# Patient Record
Sex: Male | Born: 1970
Health system: Southern US, Community
[De-identification: ages and names within clinical notes are randomized; demographics above are authoritative.]

## PROBLEM LIST (undated history)

## (undated) DIAGNOSIS — E079 Disorder of thyroid, unspecified: Secondary | ICD-10-CM

## (undated) HISTORY — DX: Disorder of thyroid, unspecified: E07.9

---

## 1998-05-30 ENCOUNTER — Emergency Department (HOSPITAL_COMMUNITY): Admission: EM | Admit: 1998-05-30 | Discharge: 1998-05-30 | Payer: Self-pay | Admitting: Emergency Medicine

## 1998-08-16 ENCOUNTER — Encounter: Admission: RE | Admit: 1998-08-16 | Discharge: 1998-08-16 | Payer: Self-pay | Admitting: Internal Medicine

## 2003-06-11 ENCOUNTER — Encounter: Payer: Self-pay | Admitting: Internal Medicine

## 2003-06-11 ENCOUNTER — Encounter: Admission: RE | Admit: 2003-06-11 | Discharge: 2003-06-11 | Payer: Self-pay | Admitting: Internal Medicine

## 2011-12-17 ENCOUNTER — Ambulatory Visit
Admission: RE | Admit: 2011-12-17 | Discharge: 2011-12-17 | Disposition: A | Discharge status: Home / Self Care | Payer: 59 | Source: Ambulatory Visit | Attending: Family Medicine | Admitting: Family Medicine

## 2011-12-17 ENCOUNTER — Other Ambulatory Visit: Payer: Self-pay | Admitting: Family Medicine

## 2011-12-17 DIAGNOSIS — R52 Pain, unspecified: Secondary | ICD-10-CM

## 2011-12-17 DIAGNOSIS — M549 Dorsalgia, unspecified: Secondary | ICD-10-CM

## 2012-06-05 ENCOUNTER — Encounter (INDEPENDENT_AMBULATORY_CARE_PROVIDER_SITE_OTHER): Payer: Self-pay | Admitting: Surgery

## 2012-06-05 ENCOUNTER — Ambulatory Visit (INDEPENDENT_AMBULATORY_CARE_PROVIDER_SITE_OTHER): Payer: 59 | Admitting: Surgery

## 2012-06-05 VITALS — BP 126/84 | HR 72 | Temp 98.2°F | Resp 16 | Ht 66.0 in | Wt 182.6 lb

## 2012-06-05 DIAGNOSIS — R229 Localized swelling, mass and lump, unspecified: Secondary | ICD-10-CM

## 2012-06-05 DIAGNOSIS — R220 Localized swelling, mass and lump, head: Secondary | ICD-10-CM

## 2012-06-05 HISTORY — PX: OTHER SURGICAL HISTORY: SHX169

## 2012-06-05 NOTE — Progress Notes (Signed)
Subjective:     Patient ID: Angel Brown, male   DOB: [birth year], 41 y.o.   MRN: [id number]  HPI  Angel Brown  January 05, 1971 [id number]  Patient Care Team: Cammie Fulp as PCP - General (Family Medicine)  This patient is a 42 y.o.male who presents today for surgical evaluation at the request of Dr. Jillyn Hidden.   Reason for visit: Enlarging masses on scalp. Patient is a pleasant male. He is felt a lump on the back side of his head for a few years. Gradually it has gotten larger. He is starting to get tender at times especially when he comes his hair or bumps up against it. He noticed a few nodules on the near the right and one on the left as well.   Because of them getting larger and becoming symptomatic, he wished to consider to have them removed. Weight stable. No history of neurological problems or other skin disorders. No history of skin infection. No history of significant active disease or other problems.  Patient Active Problem List  Diagnoses  (none) - all problems resolved or deleted    Past Medical History  Diagnosis Date  . Thyroid disease     No past surgical history on file.  History   Social History  . Marital Status: Married    Spouse Name: N/A    Number of Children: N/A  . Years of Education: N/A   Occupational History  . Not on file.   Social History Main Topics  . Smoking status: Never Smoker   . Smokeless tobacco: Not on file  . Alcohol Use: No  . Drug Use: No  . Sexually Active:    Other Topics Concern  . Not on file   Social History Narrative  . No narrative on file    No family history on file.  Current Outpatient Prescriptions  Medication Sig Dispense Refill  . levothyroxine (SYNTHROID, LEVOTHROID) 75 MCG tablet Take 75 mcg by mouth daily.         No Known Allergies  BP 126/84  Pulse 72  Temp 98.2 F (36.8 C)  Resp 16  Ht 5\' 6"  (1.676 m)  Wt 182 lb 9.6 oz (82.827 kg)  BMI 29.47 kg/m2  No results found.   Review of Systems   Constitutional: Negative for fever, chills and diaphoresis.  HENT: Negative for nosebleeds, sore throat, facial swelling, mouth sores, trouble swallowing and ear discharge.   Eyes: Negative for photophobia, discharge and visual disturbance.  Respiratory: Negative for choking, chest tightness, shortness of breath and stridor.   Cardiovascular: Negative for chest pain and palpitations.  Gastrointestinal: Negative for nausea, vomiting, abdominal pain, diarrhea, constipation, blood in stool, abdominal distention, anal bleeding and rectal pain.  Genitourinary: Negative for dysuria, urgency, difficulty urinating and testicular pain.  Musculoskeletal: Negative for myalgias, back pain, arthralgias and gait problem.  Skin: Negative for color change, pallor, rash and wound.  Neurological: Negative for dizziness, speech difficulty, weakness, numbness and headaches.  Hematological: Negative for adenopathy. Does not bruise/bleed easily.  Psychiatric/Behavioral: Negative for hallucinations, confusion and agitation.       Objective:   Physical Exam  Constitutional: He is oriented to person, place, and time. He appears well-developed and well-nourished. No distress.  HENT:  Head: Normocephalic.    Mouth/Throat: Oropharynx is clear and moist. No oropharyngeal exudate.  Eyes: Conjunctivae and EOM are normal. Pupils are equal, round, and reactive to light. No scleral icterus.  Neck: Normal range of motion. Neck supple. No  tracheal deviation present.  Cardiovascular: Normal rate, regular rhythm and intact distal pulses.   Pulmonary/Chest: Effort normal and breath sounds normal. No respiratory distress.  Abdominal: Soft. He exhibits no distension. There is no tenderness. Hernia confirmed negative in the right inguinal area and confirmed negative in the left inguinal area.  Musculoskeletal: Normal range of motion. He exhibits no tenderness.  Lymphadenopathy:    He has no cervical adenopathy.       Right:  No inguinal adenopathy present.       Left: No inguinal adenopathy present.  Neurological: He is alert and oriented to person, place, and time. No cranial nerve deficit. He exhibits normal muscle tone. Coordination normal.  Skin: Skin is warm and dry. No rash noted. He is not diaphoretic. No erythema. No pallor.  Psychiatric: He has a normal mood and affect. His behavior is normal. Judgment and thought content normal.       Assessment:     Enlarging/new scalp masses    Plan:     Consider removal. They have gotten larger and more numerous. They are probably benign. Because they had gotten larger and are becoming symptomatic, he wishes to have them removed. Given the location on the scalp, I plan to do this in the operating room under sterile conditions where hemostasis can be ensured  The pathophysiology of skin & subcutaneous masses was discussed.  Natural history risks without surgery were discussed.  I recommended surgery to remove the mass.  I explained the technique of removal with use of local anesthesia & possible need for more aggressive sedation/anesthesia for patient comfort.    Risks such as bleeding, infection, heart attack, death, and other risks were discussed.  I noted a good likelihood this will help address the problem.   Possibility that this will not correct all symptoms was explained. Possibility of regrowth/recurrence of the mass was discussed.  We will work to minimize complications. Questions were answered.  The patient expresses understanding & wishes to proceed with surgery.

## 2012-06-05 NOTE — Patient Instructions (Signed)
Cyst Removal Your caregiver has removed a cyst. A cyst is a sac containing a semi-solid material. Cysts may occur any place on your body. They may remain small for years or gradually get larger. A sebaceous cyst is an enlarged (dilated) sweat gland filled with old sweat (sebum). Unattended, these may become large (the size of a softball) over several years time. These are often removed for improved appearance (cosmetic) reasons or before they become infected to form an abscess. An abscess is an infected cyst. HOME CARE INSTRUCTIONS   Keep your bandage clean and dry. You may change your bandage after 24 hours. If your bandage sticks, use warm water to gently loosen it. Pat the area dry with a clean towel before putting on another bandage.   If possible, keep the area where the cyst was removed raised to relieve soreness, swelling, and promote healing.   If you have stitches, keep them clean and dry.   You may clean your stitches gently with a cotton swab dipped in warm soapy water.   Do not soak the area where the cyst was removed or go swimming. You may shower.   Do not overuse the area where your cyst was removed.   Return in 7 days or as directed to have your stitches removed.   Take medicines as instructed by your caregiver.  SEEK IMMEDIATE MEDICAL CARE IF:   An oral temperature above 102 F (38.9 C) develops, not controlled by medication.   Blood continues to soak through the bandage.   You have increasing pain in the area where your cyst was removed.   You have redness, swelling, pus, a bad smell, soreness (inflammation), or red streaks coming away from the stitches. These are signs of infection.  MAKE SURE YOU:   Understand these instructions.   Will watch your condition.   Will get help right away if you are not doing well or get worse.  Document Released: 12/14/2000 Document Revised: 12/06/2011 Document Reviewed: 04/08/2008 [employer] Patient Information 2012 [employer]. 

## 2012-06-06 DIAGNOSIS — R220 Localized swelling, mass and lump, head: Secondary | ICD-10-CM | POA: Insufficient documentation

## 2012-07-31 DIAGNOSIS — L723 Sebaceous cyst: Secondary | ICD-10-CM

## 2012-08-11 ENCOUNTER — Encounter (INDEPENDENT_AMBULATORY_CARE_PROVIDER_SITE_OTHER): Payer: Self-pay | Admitting: Surgery

## 2012-08-11 ENCOUNTER — Ambulatory Visit (INDEPENDENT_AMBULATORY_CARE_PROVIDER_SITE_OTHER): Payer: 59 | Admitting: Surgery

## 2012-08-11 VITALS — BP 122/78 | HR 70 | Temp 97.8°F | Resp 14 | Ht 66.0 in | Wt 181.2 lb

## 2012-08-11 DIAGNOSIS — R220 Localized swelling, mass and lump, head: Secondary | ICD-10-CM

## 2012-08-11 DIAGNOSIS — R229 Localized swelling, mass and lump, unspecified: Secondary | ICD-10-CM

## 2012-08-11 NOTE — Progress Notes (Signed)
Subjective:     Patient ID: Angel Brown, male   DOB: Jul 22, 1971, 41 y.o.   MRN: [id number]  HPI  Angel Brown  [birth year] [id number]  Patient Care Team: Cammie Fulp as PCP - General (Family Medicine)  This patient is a 41 y.o.male who presents today for surgical evaluation.   Procedure: Removal of subcutaneous masses of scalp.  Most likely pilar cysts  The patient comes in today feeling well.  Minimal soreness.  Off meds.  Energy level good.  Thought he may felt a new nodule on his right parietal region.  Patient Active Problem List  Diagnosis  . Scalp masses x4, prob pilar cysts    Past Medical History  Diagnosis Date  . Thyroid disease     Past Surgical History  Procedure Date  . Excision of scalp mass 06/05/12    x4    History   Social History  . Marital Status: Married    Spouse Name: N/A    Number of Children: N/A  . Years of Education: N/A   Occupational History  . Not on file.   Social History Main Topics  . Smoking status: Never Smoker   . Smokeless tobacco: Not on file  . Alcohol Use: No  . Drug Use: No  . Sexually Active: Not on file   Other Topics Concern  . Not on file   Social History Narrative  . No narrative on file    History reviewed. No pertinent family history.  Current Outpatient Prescriptions  Medication Sig Dispense Refill  . levothyroxine (SYNTHROID, LEVOTHROID) 75 MCG tablet Take 75 mcg by mouth daily.         No Known Allergies  BP 122/78  Pulse 70  Temp 97.8 F (36.6 C) (Temporal)  Resp 14  Ht 5\' 6"  (1.676 m)  Wt 181 lb 4 oz (82.214 kg)  BMI 29.25 kg/m2  No results found.   Review of Systems  Constitutional: Negative for fever, chills and diaphoresis.  HENT: Negative for sore throat, trouble swallowing and neck pain.   Eyes: Negative for photophobia and visual disturbance.  Respiratory: Negative for choking and shortness of breath.   Cardiovascular: Negative for chest pain and palpitations.    Gastrointestinal: Negative for nausea, vomiting, abdominal distention, anal bleeding and rectal pain.  Genitourinary: Negative for dysuria, urgency, difficulty urinating and testicular pain.  Musculoskeletal: Negative for myalgias, arthralgias and gait problem.  Skin: Negative for color change and rash.  Neurological: Negative for dizziness, speech difficulty, weakness and numbness.  Hematological: Negative for adenopathy.  Psychiatric/Behavioral: Negative for hallucinations, confusion and agitation.       Objective:   Physical Exam  Constitutional: He is oriented to person, place, and time. He appears well-developed and well-nourished. No distress.  HENT:  Head: Normocephalic.    Mouth/Throat: Oropharynx is clear and moist. No oropharyngeal exudate.  Eyes: Conjunctivae and EOM are normal. Pupils are equal, round, and reactive to light. No scleral icterus.  Neck: Normal range of motion. No tracheal deviation present.  Cardiovascular: Normal rate, normal heart sounds and intact distal pulses.   Pulmonary/Chest: Effort normal. No respiratory distress.  Abdominal: Soft. He exhibits no distension. There is no tenderness. Hernia confirmed negative in the right inguinal area and confirmed negative in the left inguinal area.        No hernias  Musculoskeletal: Normal range of motion. He exhibits no tenderness.  Neurological: He is alert and oriented to person, place, and time. No cranial nerve deficit.  He exhibits normal muscle tone. Coordination normal.  Skin: Skin is warm and dry. No rash noted. He is not diaphoretic.  Psychiatric: He has a normal mood and affect. His behavior is normal.       Assessment:     Healing well s/p excision of pilar SQ scalp cysts    Plan:     Increase activity as tolerated.  Do not push through pain.  Good chance.  The area should gradually heal up over time.  Stitches on scanner absorbable and fall off on their own.  Small questionable nodule in the  right parietal region may be just be a follicle.  Follow it.  If he gets much larger or becomes problematic, consider reexamination reexcision.  Otherwise he can followup when necessary. The patient expressed understanding and appreciation

## 2012-08-11 NOTE — Patient Instructions (Addendum)
Epidermal Cyst An epidermal cyst is usually a small, painless lump under the skin. Cysts often occur on the face, neck, stomach, chest, or genitals. The cyst may be filled with a bad smelling paste. Do not pop your cyst. Popping the cyst can cause pain and puffiness (swelling). HOME CARE   Only take medicines as told by your doctor.   Take your medicine (antibiotics) as told. Finish it even if you start to feel better.  GET HELP RIGHT AWAY IF:  Your cyst is tender, red, or puffy.   You are not getting better, or you are getting worse.   You have any questions or concerns.  MAKE SURE YOU:  Understand these instructions.   Will watch your condition.   Will get help right away if you are not doing well or get worse.  Document Released: 01/24/2005 Document Revised: 12/06/2011 Document Reviewed: 06/25/2011 [facility] Patient Information 2012 [place], [place].

## 2013-01-09 ENCOUNTER — Other Ambulatory Visit: Payer: Self-pay | Admitting: Family Medicine

## 2013-01-09 ENCOUNTER — Ambulatory Visit
Admission: RE | Admit: 2013-01-09 | Discharge: 2013-01-09 | Disposition: A | Discharge status: Home / Self Care | Payer: 59 | Source: Ambulatory Visit | Attending: Family Medicine | Admitting: Family Medicine

## 2013-01-09 DIAGNOSIS — M545 Low back pain, unspecified: Secondary | ICD-10-CM

## 2014-01-13 ENCOUNTER — Ambulatory Visit (INDEPENDENT_AMBULATORY_CARE_PROVIDER_SITE_OTHER): Payer: 59 | Admitting: Podiatrist

## 2014-01-13 ENCOUNTER — Encounter: Payer: Self-pay | Admitting: Podiatrist

## 2014-01-13 ENCOUNTER — Ambulatory Visit (INDEPENDENT_AMBULATORY_CARE_PROVIDER_SITE_OTHER): Payer: 59

## 2014-01-13 VITALS — BP 140/85 | HR 78 | Resp 16 | Ht 66.0 in | Wt 175.0 lb

## 2014-01-13 DIAGNOSIS — M2020 Hallux rigidus, unspecified foot: Secondary | ICD-10-CM

## 2014-01-13 DIAGNOSIS — M1990 Unspecified osteoarthritis, unspecified site: Secondary | ICD-10-CM

## 2014-01-13 DIAGNOSIS — M205X9 Other deformities of toe(s) (acquired), unspecified foot: Secondary | ICD-10-CM

## 2014-01-13 DIAGNOSIS — M109 Gout, unspecified: Secondary | ICD-10-CM

## 2014-01-13 MED ORDER — TRIAMCINOLONE ACETONIDE 10 MG/ML IJ SUSP
10.0000 mg | Freq: Once | INTRAMUSCULAR | Status: AC
  Administered 2014-01-13: 10 mg

## 2014-01-13 NOTE — Patient Instructions (Addendum)
I would recommend a retest of your uric acid in 1 month since the first test was at the high range of normal-- I can do the test or your primary care physician can also do the test.  Call if there is no improvement with the injection from today's visit.    Gout Gout is an inflammatory arthritis caused by a buildup of uric acid crystals in the joints. Uric acid is a chemical that is normally present in the blood. When the level of uric acid in the blood is too high it can form crystals that deposit in your joints and tissues. This causes joint redness, soreness, and swelling (inflammation). Repeat attacks are common. Over time, uric acid crystals can form into masses (tophi) near a joint, destroying bone and causing disfigurement. Gout is treatable and often preventable. CAUSES  The disease begins with elevated levels of uric acid in the blood. Uric acid is produced by your body when it breaks down a naturally found substance called purines. Certain foods you eat, such as meats and fish, contain high amounts of purines. Causes of an elevated uric acid level include:  Being passed down from parent to child (heredity).  Diseases that cause increased uric acid production (such as obesity, psoriasis, and certain cancers).  Excessive alcohol use.  Diet, especially diets rich in meat and seafood.  Medicines, including certain cancer-fighting medicines (chemotherapy), water pills (diuretics), and aspirin.  Chronic kidney disease. The kidneys are no longer able to remove uric acid well.  Problems with metabolism. Conditions strongly associated with gout include:  Obesity.  High blood pressure.  High cholesterol.  Diabetes. Not everyone with elevated uric acid levels gets gout. It is not understood why some people get gout and others do not. Surgery, joint injury, and eating too much of certain foods are some of the factors that can lead to gout attacks. SYMPTOMS   An attack of gout comes on  quickly. It causes intense pain with redness, swelling, and warmth in a joint.  Fever can occur.  Often, only one joint is involved. Certain joints are more commonly involved:  Base of the big toe.  Knee.  Ankle.  Wrist.  Finger. Without treatment, an attack usually goes away in a few days to weeks. Between attacks, you usually will not have symptoms, which is different from many other forms of arthritis. DIAGNOSIS  Your caregiver will suspect gout based on your symptoms and exam. In some cases, tests may be recommended. The tests may include:  Blood tests.  Urine tests.  X-rays.  Joint fluid exam. This exam requires a needle to remove fluid from the joint (arthrocentesis). Using a microscope, gout is confirmed when uric acid crystals are seen in the joint fluid. TREATMENT  There are two phases to gout treatment: treating the sudden onset (acute) attack and preventing attacks (prophylaxis).  Treatment of an Acute Attack.  Medicines are used. These include anti-inflammatory medicines or steroid medicines.  An injection of steroid medicine into the affected joint is sometimes necessary.  The painful joint is rested. Movement can worsen the arthritis.  You may use warm or cold treatments on painful joints, depending which works best for you.  Treatment to Prevent Attacks.  If you suffer from frequent gout attacks, your caregiver may advise preventive medicine. These medicines are started after the acute attack subsides. These medicines either help your kidneys eliminate uric acid from your body or decrease your uric acid production. You may need to stay on  these medicines for a very long time.  The early phase of treatment with preventive medicine can be associated with an increase in acute gout attacks. For this reason, during the first few months of treatment, your caregiver may also advise you to take medicines usually used for acute gout treatment. Be sure you understand  your caregiver's directions. Your caregiver may make several adjustments to your medicine dose before these medicines are effective.  Discuss dietary treatment with your caregiver or dietitian. Alcohol and drinks high in sugar and fructose and foods such as meat, poultry, and seafood can increase uric acid levels. Your caregiver or dietician can advise you on drinks and foods that should be limited. HOME CARE INSTRUCTIONS   Do not take aspirin to relieve pain. This raises uric acid levels.  Only take over-the-counter or prescription medicines for pain, discomfort, or fever as directed by your caregiver.  Rest the joint as much as possible. When in bed, keep sheets and blankets off painful areas.  Keep the affected joint raised (elevated).  Apply warm or cold treatments to painful joints. Use of warm or cold treatments depends on which works best for you.  Use crutches if the painful joint is in your leg.  Drink enough fluids to keep your urine clear or pale yellow. This helps your body get rid of uric acid. Limit alcohol, sugary drinks, and fructose drinks.  Follow your dietary instructions. Pay careful attention to the amount of protein you eat. Your daily diet should emphasize fruits, vegetables, whole grains, and fat-free or low-fat milk products. Discuss the use of coffee, vitamin C, and cherries with your caregiver or dietician. These may be helpful in lowering uric acid levels.  Maintain a healthy body weight. SEEK MEDICAL CARE IF:   You develop diarrhea, vomiting, or any side effects from medicines.  You do not feel better in 24 hours, or you are getting worse. SEEK IMMEDIATE MEDICAL CARE IF:   Your joint becomes suddenly more tender, and you have chills or a fever. MAKE SURE YOU:   Understand these instructions.  Will watch your condition.  Will get help right away if you are not doing well or get worse. Document Released: 12/14/2000 Document Revised: 04/13/2013 Document  Reviewed: 07/30/2012 [facility] Patient Information 2014 [place].  Information for patients with Gout  Gout defined-Gout occurs when urate crystals accumulate in your joint causing the inflammation and intense pain of gout attack.  Urate crystals can form when you have high levels of uric acid in your blood.  Your body produces uric acid when it breaks down prurines-substances that are found naturally in your body, as well as in certain foods such as organ meats, anchioves, herring, asparagus, and mushrooms.  Normally uric acid dissolves in your blood and passes through your kidneys into your urine.  But sometimes your body either produces too much uric acid or your kidneys excrete too little uric acid.  When this happens, uric acid can build up, forming sharp needle-like urate crystals in a joint or surrounding tissue that cause pain, inflammation and swelling.    Gout is characterized by sudden, severe attacks of pain, redness and tenderness in joints, often the joint at the base of the big toe.  Gout is complex form of arthritis that can affect anyone.  Men are more likely to get gout but women become increasingly more susceptible to gout after menopause.  An acute attack of gout can wake you up in the middle of the night  with the sensation that your big toe is on fire.  The affected joint is hot, swollen and so tender that even the weight or the sheet on it may seem intolerable.  If you experience symptoms of an acute gout attack it is important to your doctor as soon as the symptoms start.  Gout that goes untreated can lead to worsening pain and joint damage.  Risk Factors:  You are more likely to develop gout if you have high levels of uric acid in your body.    Factors that increase the uric acid level in your body include:  Lifestyle factors.  Excessive alcohol use-generally more than two drinks a day for men and more than one for women increase the risk of gout.  Medical  conditions.  Certain conditions make it more likely that you will develop gout.  These include hypertension, and chronic conditions such as diabetes, high levels of fat and cholesterol in the blood, and narrowing of the arteries.  Certain medications.  The uses of Thiazide diuretics- commonly used to treat hypertension and low dose aspirin can also increase uric acid levels.  Family history of gout.  If other members of your family have had gout, you are more likely to develop the disease.  Age and sex. Gout occurs more often in men than it does in women, primarily because women tend to have lower uric acid levels than men do.  Men are more likely to develop gout earlier usually between the ages of 22-50- whereas women generally develop signs and symptoms after menopause.    Tests and diagnosis:  Tests to help diagnose gout may include:  Blood test.  Your doctor may recommend a blood test to measure the uric acid level in your blood .  Blood tests can be misleading, though.  Some people have high uric acid levels but never experience gout.  And some people have signs and symptoms of gout, but don't have unusual levels of uric acid in their blood.  Joint fluid test.  Your doctor may use a needle to draw fluid from your affected joint.  When examined under the microscope, your joint fluid may reveal urate crystals.  Treatment:  Treatment for gout usually involves medications.  What medications you and your doctor choose will be based on your current health and other medications you currently take.  Gout medications can be used to treat acute gout attacks and prevent future attacks as well as reduce your risk of complications from gout such as the development of tophi from urate crystal deposits.  Alternative medicine:   Certain foods have been studied for their potential to lower uric acid levels, including:  Coffee.  Studies have found an association between coffee drinking (regular and  decaf) and lower uric acid levels.  The evidence is not enough to encourage non-coffee drinkers to start, but it may give clues to new ways of treating gout in the future.  Vitamin C.  Supplements containing vitamin C may reduce the levels of uric acid in your blood.  However, vitamin as a treatment for gout. Don't assume that if a little vitamin C is good, than lots is better.  Megadoses of vitamin C may increase your bodies uric acid levels.  Cherries.  Cherries have been associated with lower levels of uric acid in studies, but it isn't clear if they have any effect on gout signs and symptoms.  Eating more cherries and other dar-colored fruits, such as blackberries, blueberries, purple grapes and  raspberries, may be a safe way to support your gout treatment.    Lifestyle/Diet Recommendations:   Drink 8 to 16 cups ( about 2 to 4 liters) of fluid each day, with at least half being water.  Avoid alcohol  Eat a moderate amount of protein, preferably from healthy sources, such as low-fat or fat-free dairy, tofu, eggs, and nut butters.  Limit you daily intake of meat, fish, and poultry to 4 to 6 ounces.  Avoid high fat meats and desserts.  Decrease you intake of shellfish, beef, lamb, pork, eggs and cheese.  Choose a good source of vitamin C daily such as citrus fruits, strawberries, broccoli,  brussel sprouts, papaya, and cantaloupe.   Choose a good source of vitamin A every other day such as yellow fruits, or dark green/yellow vegetables.  Avoid drastic weigh reduction or fasting.  If weigh loss is desired lose it over a period of several months.  See "dietary considerations.." chart for specific food recommendations.  Dietary Considerations for people with Gout  Food with negligible purine content (0-15 mg of purine nitrogen per 100 grams food)  May use as desired except on calorie variations  Non fat milk Cocoa Cereals (except in list II) Hard candies  Buttermilk Carbonated drinks  Vegetables (except in list II) Sherbet  Coffee Fruits Sugar Honey  Tea Cottage Cheese Gelatin-jell-o Salt  Fruit juice Breads Angel food Cake   Herbs/spices Jams/Jellies [employer]    Foods that do not contain excessive purine content, but must be limited due to fat content  Cream Eggs Oil and Salad Dressing  Half and Half Peanut Butter Chocolate  Whole Milk Cakes Potato Chips  Butter Ice Cream Fried Foods  Cheese Nuts Waffles, pancakes   List II: Food with moderate purine content (50-150 mg of purine nitrogen per 100 grams of food)  Limit total amount each day to 5 oz. cooked Lean meat, other than those on list III   Poultry, other than those on list III Fish, other than those on list III   Seafood, other than those on list III  These foods may be used occasionally  Peas Lentils Bran  Spinach Oatmeal Dried Beans and Peas  Asparagus Wheat Germ Mushrooms   Additional information about meat choices  Choose fish and poultry, particularly without skin, often.  Select lean, well trimmed cuts of meat.  Avoid all fatty meats, bacon , sausage, fried meats, fried fish, or poultry, luncheon meats, cold cuts, hot dogs, meats canned or frozen in gravy, spareribs and frozen and packaged prepared meats.   List III: Foods with HIGH purine content / Foods to AVOID (150-800 mg of purine nitrogen per 100 grams of food)  Anchovies Herring Meat Broths  Liver Mackerel Meat Extracts  Kidney Scallops Meat Drippings  Sardines Wild Game Mincemeat  Sweetbreads Goose Gravy  Heart Tongue Yeast, baker's and brewers   Commercial soups made with any of the foods listed in List II or List III  In addition avoid all alcoholic beverages

## 2014-01-13 NOTE — Progress Notes (Signed)
   Subjective:    Patient ID: Angel BernheimMaruthy K Haworth, male    DOB: Oct 27, 1971, 43 y.o.   MRN: [id number]  HPI Comments: "I have pain in this area under the toe"  Pt states has been having aching sensations in 1st MPJ right for few weeks. The area was somewhat red and swollen. He has limited ROM, especially dorsiflexion. Its painful to walk. PCP evaluated thought maybe gout, did bloodwork and Rx'd indocin. Blood work showed normal uric acid. Not currently taking Indocin.     Review of Systems  Musculoskeletal: Positive for gait problem.  Allergic/Immunologic: Positive for environmental allergies.  All other systems reviewed and are negative.       Objective:   Physical Exam GENERAL APPEARANCE: Alert, conversant. Appropriately groomed. No acute distress.  VASCULAR: Pedal pulses palpable and strong bilateral.  Capillary refill time is immediate to all digits,  Proximal to distal cooling it warm to warm.  Digital hair growth is present bilateral  NEUROLOGIC: sensation is intact epicritically and protectively to 5.07 monofilament at 5/5 sites bilateral.  Light touch is intact bilateral, vibratory sensation intact bilateral, achilles tendon reflex is intact bilateral.  MUSCULOSKELETAL: discomfort and swelling present at the first mpj of the right foot.  No limitation on range of motion present. DERMATOLOGIC: mild increased swelling present.  No redness, streaking or signs of infection present.  No open lesions or interdigital maceration present       Assessment & Plan:  Probable gout versus arthritis 1st mpj Plan:  Discussed etiology, pathology, conservative vs. Surgical therapies and at this time an injection was recommended.  The patient agreed and a sterile skin prep was applied.  An injection consisting of kenalog and marcaine mixture was infiltrated into the right first metararsal phalangeal joint.  The patient tolerated this well and was given instructions for aftercare. The patient was  instructed on retesting the uric acid in one month. He is at the high end of normal and still could be positive for gout.

## 2014-04-06 ENCOUNTER — Ambulatory Visit (INDEPENDENT_AMBULATORY_CARE_PROVIDER_SITE_OTHER): Payer: 59 | Admitting: Podiatry

## 2014-04-06 ENCOUNTER — Encounter: Payer: Self-pay | Admitting: Podiatry

## 2014-04-06 DIAGNOSIS — M778 Other enthesopathies, not elsewhere classified: Secondary | ICD-10-CM

## 2014-04-06 DIAGNOSIS — M7750 Other enthesopathy of unspecified foot: Secondary | ICD-10-CM

## 2014-04-06 DIAGNOSIS — M109 Gout, unspecified: Secondary | ICD-10-CM

## 2014-04-06 DIAGNOSIS — M779 Enthesopathy, unspecified: Secondary | ICD-10-CM

## 2014-04-06 MED ORDER — INDOMETHACIN 50 MG PO CAPS: 50.0000 mg | ORAL_CAPSULE | Freq: Two times a day (BID) | ORAL | Status: DC

## 2014-04-06 NOTE — Patient Instructions (Signed)

## 2014-04-06 NOTE — Progress Notes (Signed)
He presents today after having seen Dr. Donzetta KohutEdgerton for gout attack about the first metatarsophalangeal joint. He never followed up on his second blood work. He states it is been like this for the past 2-3 days and is extremely painful. He states that he's been following the gout diet and is pretty much a vegetarian.  Objective: Vital signs are stable he is alert and oriented x3. Pulses are palpable right. Bright red first metatarsophalangeal joint medial aspect of the first MTPJ is very sensitive and tender. Range of motion of the first metatarsophalangeal joint is tender.  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  Plan: Discussed etiology pathology conservative versus surgical therapies. I a injected periarticularly  today about the first metatarsophalangeal joint right foot. This was injected with Kenalog and local anesthetic. I wrote a prescription for indomethacin and wrote another prescription for blood draw within the next 2-3 weeks. At which time we may consider putting him on a lifelong regimen of allopurinol.

## 2014-04-20 ENCOUNTER — Telehealth: Payer: Self-pay | Admitting: *Deleted

## 2014-04-20 LAB — CBC WITH DIFFERENTIAL/PLATELET
BASOS ABS: 0 10*3/uL (ref 0.0–0.1)
BASOS PCT: 0 % (ref 0–1)
EOS ABS: 0.2 10*3/uL (ref 0.0–0.7)
EOS PCT: 3 % (ref 0–5)
HCT: 45.7 % (ref 39.0–52.0)
HEMOGLOBIN: 15.1 g/dL (ref 13.0–17.0)
Lymphocytes Relative: 30 % (ref 12–46)
Lymphs Abs: 2 10*3/uL (ref 0.7–4.0)
MCH: 27.4 pg (ref 26.0–34.0)
MCHC: 33 g/dL (ref 30.0–36.0)
MCV: 82.9 fL (ref 78.0–100.0)
MONO ABS: 0.7 10*3/uL (ref 0.1–1.0)
Monocytes Relative: 10 % (ref 3–12)
Neutro Abs: 3.9 10*3/uL (ref 1.7–7.7)
Neutrophils Relative %: 57 % (ref 43–77)
PLATELETS: 344 10*3/uL (ref 150–400)
RBC: 5.51 MIL/uL (ref 4.22–5.81)
RDW: 13.9 % (ref 11.5–15.5)
WBC: 6.8 10*3/uL (ref 4.0–10.5)

## 2014-04-20 LAB — URIC ACID: Uric Acid, Serum: 5.9 mg/dL (ref 4.0–7.8)

## 2014-04-20 NOTE — Telephone Encounter (Signed)
Message copied by Bing ReeGUNN, Emory Leaver L on Tue Apr 20, 2014  4:14 PM ------      Message from: Ernestene KielHYATT, MAX T      Created: Tue Apr 20, 2014  7:27 AM       Blood work looks good.  No infection and uric acid is normal. ------

## 2014-04-20 NOTE — Telephone Encounter (Signed)
Called and left message for patient regarding bloodwork

## 2014-04-27 ENCOUNTER — Telehealth: Payer: Self-pay | Admitting: *Deleted

## 2014-04-27 NOTE — Telephone Encounter (Signed)
Calling to get lab work results from 04/19/2014.  I returned his call.  He asked if we could mail him the report.  I informed him he can come by to pick up a copy of the report.  He said he would come by tomorrow to pick it up.

## 2015-04-27 ENCOUNTER — Ambulatory Visit (INDEPENDENT_AMBULATORY_CARE_PROVIDER_SITE_OTHER): Payer: 59 | Admitting: Podiatry

## 2015-04-27 ENCOUNTER — Encounter: Payer: Self-pay | Admitting: Podiatry

## 2015-04-27 ENCOUNTER — Ambulatory Visit (INDEPENDENT_AMBULATORY_CARE_PROVIDER_SITE_OTHER): Payer: 59

## 2015-04-27 VITALS — BP 120/84 | HR 75 | Resp 18

## 2015-04-27 DIAGNOSIS — B351 Tinea unguium: Secondary | ICD-10-CM | POA: Diagnosis not present

## 2015-04-27 DIAGNOSIS — R52 Pain, unspecified: Secondary | ICD-10-CM

## 2015-04-27 DIAGNOSIS — M10071 Idiopathic gout, right ankle and foot: Secondary | ICD-10-CM

## 2015-04-27 DIAGNOSIS — M109 Gout, unspecified: Secondary | ICD-10-CM

## 2015-04-27 LAB — COMPLETE METABOLIC PANEL WITH GFR
ALBUMIN: 4.7 g/dL (ref 3.5–5.2)
ALT: 22 U/L (ref 0–53)
AST: 19 U/L (ref 0–37)
Alkaline Phosphatase: 69 U/L (ref 39–117)
BUN: 13 mg/dL (ref 6–23)
CO2: 31 mEq/L (ref 19–32)
Calcium: 9.7 mg/dL (ref 8.4–10.5)
Chloride: 100 mEq/L (ref 96–112)
Creat: 1.18 mg/dL (ref 0.50–1.35)
GFR, EST AFRICAN AMERICAN: 86 mL/min
GFR, EST NON AFRICAN AMERICAN: 75 mL/min
Glucose, Bld: 81 mg/dL (ref 70–99)
Potassium: 4.9 mEq/L (ref 3.5–5.3)
Sodium: 139 mEq/L (ref 135–145)
Total Bilirubin: 0.7 mg/dL (ref 0.2–1.2)
Total Protein: 7.9 g/dL (ref 6.0–8.3)

## 2015-04-27 LAB — CBC WITH DIFFERENTIAL/PLATELET
Basophils Absolute: 0 10*3/uL (ref 0.0–0.1)
Basophils Relative: 0 % (ref 0–1)
Eosinophils Absolute: 0.1 10*3/uL (ref 0.0–0.7)
Eosinophils Relative: 2 % (ref 0–5)
HCT: 45.7 % (ref 39.0–52.0)
HEMOGLOBIN: 15.5 g/dL (ref 13.0–17.0)
LYMPHS ABS: 2 10*3/uL (ref 0.7–4.0)
Lymphocytes Relative: 27 % (ref 12–46)
MCH: 28.4 pg (ref 26.0–34.0)
MCHC: 33.9 g/dL (ref 30.0–36.0)
MCV: 83.9 fL (ref 78.0–100.0)
MPV: 9.2 fL (ref 8.6–12.4)
Monocytes Absolute: 0.7 10*3/uL (ref 0.1–1.0)
Monocytes Relative: 10 % (ref 3–12)
NEUTROS PCT: 61 % (ref 43–77)
Neutro Abs: 4.5 10*3/uL (ref 1.7–7.7)
PLATELETS: 346 10*3/uL (ref 150–400)
RBC: 5.45 MIL/uL (ref 4.22–5.81)
RDW: 14.2 % (ref 11.5–15.5)
WBC: 7.4 10*3/uL (ref 4.0–10.5)

## 2015-04-27 LAB — C-REACTIVE PROTEIN: CRP: 0.9 mg/dL — AB (ref <0.60)

## 2015-04-27 LAB — URIC ACID: Uric Acid, Serum: 7.8 mg/dL (ref 4.0–7.8)

## 2015-04-27 NOTE — Patient Instructions (Signed)

## 2015-04-28 ENCOUNTER — Telehealth: Payer: Self-pay | Admitting: *Deleted

## 2015-04-28 LAB — SEDIMENTATION RATE: SED RATE: 6 mm/h (ref 0–15)

## 2015-04-28 MED ORDER — COLCHICINE 0.6 MG PO TABS: 0.6000 mg | ORAL_TABLET | Freq: Every day | ORAL | Status: DC

## 2015-04-28 NOTE — Telephone Encounter (Signed)
-----   Message from Vivi BarrackMatthew R Wagoner, DPM sent at 04/28/2015  8:12 AM EDT ----- Can someone please call this patient and let him know that is blood work was normal for the most part. One of the inflammation markers was a little elevated and the uric acid was a the very top of normal. I do think it is gout still and will continue to treat for it. Thanks.

## 2015-04-28 NOTE — Telephone Encounter (Signed)
I contacted the pt informed of Dr. Gabriel RungWagoner's statement and recommendations.

## 2015-04-28 NOTE — Telephone Encounter (Addendum)
Pt called states no medication at the pharmacy, please call. Informed pt's wife that the medication had been ordered.

## 2015-04-28 NOTE — Telephone Encounter (Signed)
I sent it back over. Thanks.

## 2015-05-01 ENCOUNTER — Encounter: Payer: Self-pay | Admitting: Podiatry

## 2015-05-01 DIAGNOSIS — M109 Gout, unspecified: Secondary | ICD-10-CM | POA: Insufficient documentation

## 2015-05-01 NOTE — Progress Notes (Signed)
Patient ID: Angel BernheimMaruthy K Cristina, male   DOB: 1971-08-30, 44 y.o.   MRN: [id number]  Subjective: 44 year old male presents the office today with complaints of right big toe joint swelling, redness, pain. He states on Monday the symptoms started. He doesn't that he has a history of gout he had multiple gout attacks over the past year. He states that the last time he was in the office he had a steroid injection which helped alleviate his symptoms. He denies any change in his diet. He denies any history of injury or trauma. The patient is also concerned about possible nail fungus. States his nails have started become slightly discolored and a different shape. He denies any pain associate the nails and denies any redness or drainage. Denies any systemic complaints such as fevers, chills, nausea, vomiting. No acute changes since last appointment, and no other complaints at this time.   Objective: AAO x3, NAD DP/PT pulses palpable bilaterally, CRT less than 3 seconds Protective sensation intact with Simms Weinstein monofilament, vibratory sensation intact, Achilles tendon reflex intact There is mild tenderness to palpation along the right first MTPJ. There is edema and erythema overlying the area. There is pain with range of motion of the first MTPJ. There is no other areas of edema, erythema, tenderness to bilateral lower extremity's. No areas of pinpoint bony tenderness or pain with vibratory sensation. MMT 5/5, ROM WNL. No edema, erythema, increase in warmth to bilateral lower extremities.  Nails are slightly dystrophic, discolored without any surrounding erythema or drainage. There is no tenderness to palpation around the nails. No open lesions or pre-ulcerative lesions.  No pain with calf compression, swelling, warmth, erythema  Assessment: 44 year old male right reoccurring gout; onychodystrophy  Plan: -X-rays were obtained and reviewed with the patient -All treatment options discussed with the  patient including all alternatives, risks, complications.  -Discussed likely etiology of his symptoms. Does appear that he is having another gout attack. Discussed steroid injection to the area for which she were to proceed with. Under sterile conditions a total of 1.5 mL of a mixture of dexamethasone phosphate and 0.5% Marcaine plain was infiltrated into the area of maximal tenderness on the right first MTPJ. Band-Aid was applied. Postinjection care was discussed the patient. -Prescribed colchicine. -Discussed diet modifications. -As he does appear to have multiple gout attacks of the same foot I do believe that he would likely benefit from long-term therapy. We'll discuss this further next appointment. -[facility]osis.  -Patient encouraged to call the office with any questions, concerns, change in symptoms.

## 2015-05-03 ENCOUNTER — Telehealth: Payer: Self-pay | Admitting: *Deleted

## 2015-05-04 ENCOUNTER — Telehealth: Payer: Self-pay | Admitting: *Deleted

## 2015-05-04 MED ORDER — COLCHICINE 0.6 MG PO TABS: 0.6000 mg | ORAL_TABLET | Freq: Every day | ORAL | Status: DC

## 2015-05-04 NOTE — Telephone Encounter (Signed)
Pt's wife states the pharmacy has not received a prior authorization for Colchicine.  Delydia - pt coordinator states [facility] has not sent authorization yet.  I called pt and informed if she would choose a [facility] pharmacy we could call it there, but not to give insurance information to pharmacy and the $4.00 formulary would be used.

## 2015-05-09 ENCOUNTER — Other Ambulatory Visit: Payer: Self-pay | Admitting: *Deleted

## 2015-05-09 NOTE — Telephone Encounter (Signed)
I called patient to see how he was doing per Dr. Ardelle AntonWagoner because his Colchicine was denied by his insurance.  "My foot is doing better since I started taking the medication.  Hopefully by the time I come in to see him on Friday it'll be completely better."  Okay, I'll let Dr. Ardelle AntonWagoner know.

## 2015-05-13 ENCOUNTER — Ambulatory Visit (INDEPENDENT_AMBULATORY_CARE_PROVIDER_SITE_OTHER): Payer: 59 | Admitting: Podiatry

## 2015-05-13 ENCOUNTER — Encounter: Payer: Self-pay | Admitting: Podiatry

## 2015-05-13 VITALS — BP 138/86 | HR 62 | Resp 12

## 2015-05-13 DIAGNOSIS — L603 Nail dystrophy: Secondary | ICD-10-CM | POA: Diagnosis not present

## 2015-05-13 DIAGNOSIS — M10071 Idiopathic gout, right ankle and foot: Secondary | ICD-10-CM

## 2015-05-13 DIAGNOSIS — M109 Gout, unspecified: Secondary | ICD-10-CM

## 2015-05-13 NOTE — Progress Notes (Signed)
Patient ID: Angel BernheimMaruthy K Brown, Angel Brown   DOB: [birth year], 44 y.o.   MRN: [id number]  Subjective: 44 year old Angel Brown presents the office today for follow-up evaluation of right foot gout. He states that his last appointment he did take the colchicine and he has had significant improvement in symptoms. He states the swelling has decreased as well as the redness. He has no pain to the toe at this time. He is also inquiring about his nail fungal culture results. Denies any systemic complaints such as fevers, chills, nausea, vomiting. No acute changes since last appointment, and no other complaints at this time.   Objective: AAO x3, NAD DP/PT pulses palpable bilaterally, CRT less than 3 seconds Protective sensation intact with Simms Weinstein monofilament, vibratory sensation intact, Achilles tendon reflex intact At this time there is significant improvement in symptoms of the right first MTPJ. There is no upon edema, erythema, increase in warmth. There is no pain with first MTPJ range of motion although the range of motion is somewhat limited. Nails continue be sightly dystrophic and discolored without any surrounding erythema or drainage. No tenderness to palpation along the nails. No other areas of pinpoint bony tenderness or pain with vibratory sensation. MMT 5/5, ROM WNL. No edema, erythema, increase in warmth to bilateral lower extremities.  No open lesions or pre-ulcerative lesions.  No pain with calf compression, swelling, warmth, erythema  Assessment: [age] year old Angel Brown with resolved gout right foot, onychodystrophy  Plan: -All treatment options discussed with the patient including all alternatives, risks, complications.  -At this time his symptoms for gout resolved. Discussed the patient long-term therapy due to his multiple recurrent gout attacks. At this time he elects to proceed with a modifications to see if this helps. I strongly encouraged patient to follow with his primary care physician in  regards to gout treatment.  -Will call the patient with the results of the nail biopsy once the culture is obtained. He will try topical treatment if positive for nail fungus. Will likely start Kerydin.  -Follow up after nail biopsy or sooner should if any problems arise.  -Patient encouraged to call the office with any questions, concerns, change in symptoms.

## 2015-05-18 ENCOUNTER — Telehealth: Payer: Self-pay | Admitting: *Deleted

## 2015-05-18 MED ORDER — COLCHICINE 0.6 MG PO CAPS: 1.0000 | ORAL_CAPSULE | Freq: Every day | ORAL | Status: DC

## 2015-05-18 MED ORDER — TAVABOROLE 5 % EX SOLN: 1.0000 "application " | Freq: Every day | CUTANEOUS | Status: DC

## 2015-05-18 NOTE — Telephone Encounter (Signed)
"  Calling regarding my next appointment with Dr. Ardelle AntonWagoner.  I was wondering if I could do it over the phone.  The last time I saw Dr. Ardelle AntonWagoner he had said we would do it over the phone.  I also need to talk to Dr. Ardelle AntonWagoner about the medication, there's a generic."  I returned his call.  "Dr. Ardelle AntonWagoner said I could tell you over the phone.  Your culture came back positive for Saprophytic Fungi.  Dr. Ardelle AntonWagoner said he can prescribe Jodi GeraldsKerydin but it may not be successful.  "Okay, I'd like to try it.  I also want to know if he could call in Mitigare which is the generic for what he prescribed."  I called in before but I will send it again.  I sent it to [facility] before.  Also, your Jodi GeraldsKerydin will come in the mail from a pharmacy called [facility], they may call you to get insurance information.  "Okay, that will be great."

## 2015-05-27 ENCOUNTER — Ambulatory Visit: Payer: 59 | Admitting: Podiatry

## 2015-06-01 ENCOUNTER — Telehealth: Payer: Self-pay | Admitting: *Deleted

## 2015-06-01 MED ORDER — CICLOPIROX 8 % EX SOLN: Freq: Every day | CUTANEOUS | Status: DC

## 2015-06-01 NOTE — Telephone Encounter (Signed)
I left patient a message to call me back in regards to his prescription.  I need to inform him that his insurance will not cover Kerydin.  Dr. Ardelle AntonWagoner has prescribed Penlac.

## 2015-06-02 NOTE — Telephone Encounter (Signed)
Pt called for information concerning his medication.  I reviewed pt's records and Dr. Ardelle AntonWagoner stated pt's insurance would not cover the Kerydin, orders changed to Penlac and sent to pt's pharmacy.  I informed pt of new orders and pt states understanding.

## 2015-09-20 ENCOUNTER — Other Ambulatory Visit: Payer: Self-pay | Admitting: Podiatry

## 2015-10-27 ENCOUNTER — Encounter: Payer: Self-pay | Admitting: Podiatry

## 2016-12-26 ENCOUNTER — Telehealth: Payer: Self-pay | Admitting: *Deleted

## 2016-12-26 MED ORDER — NONFORMULARY OR COMPOUNDED ITEM: 2 refills | Status: DC

## 2016-12-26 NOTE — Telephone Encounter (Signed)
Pt presents to office with dtr for her follow up with Dr. Ardelle AntonWagoner. Pt asked for refills of Penlac and Mitigare. Dr. Ardelle AntonWagoner states if pt has been on Penlac for a year change to [place] Onychomycosis nail fungus, and to wait until he had a flare of gout and make an appt. Pt states understanding.

## 2017-02-18 DIAGNOSIS — J3089 Other allergic rhinitis: Secondary | ICD-10-CM | POA: Diagnosis not present

## 2017-02-18 DIAGNOSIS — J3081 Allergic rhinitis due to animal (cat) (dog) hair and dander: Secondary | ICD-10-CM | POA: Diagnosis not present

## 2017-02-18 DIAGNOSIS — J301 Allergic rhinitis due to pollen: Secondary | ICD-10-CM | POA: Diagnosis not present

## 2017-02-22 DIAGNOSIS — J301 Allergic rhinitis due to pollen: Secondary | ICD-10-CM | POA: Diagnosis not present

## 2017-02-22 DIAGNOSIS — J3089 Other allergic rhinitis: Secondary | ICD-10-CM | POA: Diagnosis not present

## 2017-02-22 DIAGNOSIS — J3081 Allergic rhinitis due to animal (cat) (dog) hair and dander: Secondary | ICD-10-CM | POA: Diagnosis not present

## 2017-02-25 DIAGNOSIS — J301 Allergic rhinitis due to pollen: Secondary | ICD-10-CM | POA: Diagnosis not present

## 2017-02-25 DIAGNOSIS — J3081 Allergic rhinitis due to animal (cat) (dog) hair and dander: Secondary | ICD-10-CM | POA: Diagnosis not present

## 2017-02-25 DIAGNOSIS — J3089 Other allergic rhinitis: Secondary | ICD-10-CM | POA: Diagnosis not present

## 2017-03-02 DIAGNOSIS — M47812 Spondylosis without myelopathy or radiculopathy, cervical region: Secondary | ICD-10-CM | POA: Diagnosis not present

## 2017-03-02 DIAGNOSIS — M9901 Segmental and somatic dysfunction of cervical region: Secondary | ICD-10-CM | POA: Diagnosis not present

## 2017-03-02 DIAGNOSIS — M47814 Spondylosis without myelopathy or radiculopathy, thoracic region: Secondary | ICD-10-CM | POA: Diagnosis not present

## 2017-03-02 DIAGNOSIS — M9902 Segmental and somatic dysfunction of thoracic region: Secondary | ICD-10-CM | POA: Diagnosis not present

## 2017-03-04 DIAGNOSIS — J301 Allergic rhinitis due to pollen: Secondary | ICD-10-CM | POA: Diagnosis not present

## 2017-03-04 DIAGNOSIS — J3089 Other allergic rhinitis: Secondary | ICD-10-CM | POA: Diagnosis not present

## 2017-03-08 DIAGNOSIS — J3089 Other allergic rhinitis: Secondary | ICD-10-CM | POA: Diagnosis not present

## 2017-03-08 DIAGNOSIS — J3081 Allergic rhinitis due to animal (cat) (dog) hair and dander: Secondary | ICD-10-CM | POA: Diagnosis not present

## 2017-03-08 DIAGNOSIS — J301 Allergic rhinitis due to pollen: Secondary | ICD-10-CM | POA: Diagnosis not present

## 2017-03-15 DIAGNOSIS — J3081 Allergic rhinitis due to animal (cat) (dog) hair and dander: Secondary | ICD-10-CM | POA: Diagnosis not present

## 2017-03-15 DIAGNOSIS — J3089 Other allergic rhinitis: Secondary | ICD-10-CM | POA: Diagnosis not present

## 2017-03-15 DIAGNOSIS — J301 Allergic rhinitis due to pollen: Secondary | ICD-10-CM | POA: Diagnosis not present

## 2017-03-15 DIAGNOSIS — Z8639 Personal history of other endocrine, nutritional and metabolic disease: Secondary | ICD-10-CM | POA: Diagnosis not present

## 2017-03-15 DIAGNOSIS — E559 Vitamin D deficiency, unspecified: Secondary | ICD-10-CM | POA: Diagnosis not present

## 2017-03-15 DIAGNOSIS — E039 Hypothyroidism, unspecified: Secondary | ICD-10-CM | POA: Diagnosis not present

## 2017-03-19 DIAGNOSIS — E785 Hyperlipidemia, unspecified: Secondary | ICD-10-CM | POA: Diagnosis not present

## 2017-03-19 DIAGNOSIS — R739 Hyperglycemia, unspecified: Secondary | ICD-10-CM | POA: Diagnosis not present

## 2017-03-19 DIAGNOSIS — E039 Hypothyroidism, unspecified: Secondary | ICD-10-CM | POA: Diagnosis not present

## 2017-03-19 DIAGNOSIS — E559 Vitamin D deficiency, unspecified: Secondary | ICD-10-CM | POA: Diagnosis not present

## 2017-03-22 DIAGNOSIS — J3081 Allergic rhinitis due to animal (cat) (dog) hair and dander: Secondary | ICD-10-CM | POA: Diagnosis not present

## 2017-03-22 DIAGNOSIS — J3089 Other allergic rhinitis: Secondary | ICD-10-CM | POA: Diagnosis not present

## 2017-03-22 DIAGNOSIS — J301 Allergic rhinitis due to pollen: Secondary | ICD-10-CM | POA: Diagnosis not present

## 2017-04-05 DIAGNOSIS — J3081 Allergic rhinitis due to animal (cat) (dog) hair and dander: Secondary | ICD-10-CM | POA: Diagnosis not present

## 2017-04-05 DIAGNOSIS — J301 Allergic rhinitis due to pollen: Secondary | ICD-10-CM | POA: Diagnosis not present

## 2017-04-05 DIAGNOSIS — J3089 Other allergic rhinitis: Secondary | ICD-10-CM | POA: Diagnosis not present

## 2017-04-11 DIAGNOSIS — J3081 Allergic rhinitis due to animal (cat) (dog) hair and dander: Secondary | ICD-10-CM | POA: Diagnosis not present

## 2017-04-11 DIAGNOSIS — J3089 Other allergic rhinitis: Secondary | ICD-10-CM | POA: Diagnosis not present

## 2017-04-11 DIAGNOSIS — J301 Allergic rhinitis due to pollen: Secondary | ICD-10-CM | POA: Diagnosis not present

## 2017-04-18 DIAGNOSIS — J3089 Other allergic rhinitis: Secondary | ICD-10-CM | POA: Diagnosis not present

## 2017-04-18 DIAGNOSIS — J301 Allergic rhinitis due to pollen: Secondary | ICD-10-CM | POA: Diagnosis not present

## 2017-04-24 DIAGNOSIS — J301 Allergic rhinitis due to pollen: Secondary | ICD-10-CM | POA: Diagnosis not present

## 2017-04-24 DIAGNOSIS — J3081 Allergic rhinitis due to animal (cat) (dog) hair and dander: Secondary | ICD-10-CM | POA: Diagnosis not present

## 2017-04-24 DIAGNOSIS — J3089 Other allergic rhinitis: Secondary | ICD-10-CM | POA: Diagnosis not present

## 2017-05-03 DIAGNOSIS — J301 Allergic rhinitis due to pollen: Secondary | ICD-10-CM | POA: Diagnosis not present

## 2017-05-03 DIAGNOSIS — J3081 Allergic rhinitis due to animal (cat) (dog) hair and dander: Secondary | ICD-10-CM | POA: Diagnosis not present

## 2017-05-03 DIAGNOSIS — J3089 Other allergic rhinitis: Secondary | ICD-10-CM | POA: Diagnosis not present

## 2017-05-10 DIAGNOSIS — J3081 Allergic rhinitis due to animal (cat) (dog) hair and dander: Secondary | ICD-10-CM | POA: Diagnosis not present

## 2017-05-10 DIAGNOSIS — J301 Allergic rhinitis due to pollen: Secondary | ICD-10-CM | POA: Diagnosis not present

## 2017-05-10 DIAGNOSIS — J3089 Other allergic rhinitis: Secondary | ICD-10-CM | POA: Diagnosis not present

## 2017-05-17 DIAGNOSIS — J301 Allergic rhinitis due to pollen: Secondary | ICD-10-CM | POA: Diagnosis not present

## 2017-05-17 DIAGNOSIS — J3089 Other allergic rhinitis: Secondary | ICD-10-CM | POA: Diagnosis not present

## 2017-05-17 DIAGNOSIS — J3081 Allergic rhinitis due to animal (cat) (dog) hair and dander: Secondary | ICD-10-CM | POA: Diagnosis not present

## 2017-05-24 DIAGNOSIS — J301 Allergic rhinitis due to pollen: Secondary | ICD-10-CM | POA: Diagnosis not present

## 2017-05-24 DIAGNOSIS — J3089 Other allergic rhinitis: Secondary | ICD-10-CM | POA: Diagnosis not present

## 2017-05-24 DIAGNOSIS — J3081 Allergic rhinitis due to animal (cat) (dog) hair and dander: Secondary | ICD-10-CM | POA: Diagnosis not present

## 2017-05-28 DIAGNOSIS — J3089 Other allergic rhinitis: Secondary | ICD-10-CM | POA: Diagnosis not present

## 2017-05-28 DIAGNOSIS — J301 Allergic rhinitis due to pollen: Secondary | ICD-10-CM | POA: Diagnosis not present

## 2017-05-28 DIAGNOSIS — J3081 Allergic rhinitis due to animal (cat) (dog) hair and dander: Secondary | ICD-10-CM | POA: Diagnosis not present

## 2017-05-31 DIAGNOSIS — J3089 Other allergic rhinitis: Secondary | ICD-10-CM | POA: Diagnosis not present

## 2017-05-31 DIAGNOSIS — J3081 Allergic rhinitis due to animal (cat) (dog) hair and dander: Secondary | ICD-10-CM | POA: Diagnosis not present

## 2017-05-31 DIAGNOSIS — J301 Allergic rhinitis due to pollen: Secondary | ICD-10-CM | POA: Diagnosis not present

## 2017-06-06 DIAGNOSIS — J3081 Allergic rhinitis due to animal (cat) (dog) hair and dander: Secondary | ICD-10-CM | POA: Diagnosis not present

## 2017-06-06 DIAGNOSIS — J301 Allergic rhinitis due to pollen: Secondary | ICD-10-CM | POA: Diagnosis not present

## 2017-06-06 DIAGNOSIS — J3089 Other allergic rhinitis: Secondary | ICD-10-CM | POA: Diagnosis not present

## 2017-06-13 DIAGNOSIS — J3081 Allergic rhinitis due to animal (cat) (dog) hair and dander: Secondary | ICD-10-CM | POA: Diagnosis not present

## 2017-06-13 DIAGNOSIS — J3089 Other allergic rhinitis: Secondary | ICD-10-CM | POA: Diagnosis not present

## 2017-06-13 DIAGNOSIS — J301 Allergic rhinitis due to pollen: Secondary | ICD-10-CM | POA: Diagnosis not present

## 2017-06-20 DIAGNOSIS — J3089 Other allergic rhinitis: Secondary | ICD-10-CM | POA: Diagnosis not present

## 2017-06-20 DIAGNOSIS — J301 Allergic rhinitis due to pollen: Secondary | ICD-10-CM | POA: Diagnosis not present

## 2017-06-20 DIAGNOSIS — J3081 Allergic rhinitis due to animal (cat) (dog) hair and dander: Secondary | ICD-10-CM | POA: Diagnosis not present

## 2017-06-26 DIAGNOSIS — E559 Vitamin D deficiency, unspecified: Secondary | ICD-10-CM | POA: Diagnosis not present

## 2017-06-28 DIAGNOSIS — J301 Allergic rhinitis due to pollen: Secondary | ICD-10-CM | POA: Diagnosis not present

## 2017-06-28 DIAGNOSIS — J3089 Other allergic rhinitis: Secondary | ICD-10-CM | POA: Diagnosis not present

## 2017-06-28 DIAGNOSIS — J3081 Allergic rhinitis due to animal (cat) (dog) hair and dander: Secondary | ICD-10-CM | POA: Diagnosis not present

## 2017-07-09 DIAGNOSIS — J3089 Other allergic rhinitis: Secondary | ICD-10-CM | POA: Diagnosis not present

## 2017-07-09 DIAGNOSIS — J301 Allergic rhinitis due to pollen: Secondary | ICD-10-CM | POA: Diagnosis not present

## 2017-07-09 DIAGNOSIS — J3081 Allergic rhinitis due to animal (cat) (dog) hair and dander: Secondary | ICD-10-CM | POA: Diagnosis not present

## 2017-07-16 DIAGNOSIS — J3081 Allergic rhinitis due to animal (cat) (dog) hair and dander: Secondary | ICD-10-CM | POA: Diagnosis not present

## 2017-07-16 DIAGNOSIS — J301 Allergic rhinitis due to pollen: Secondary | ICD-10-CM | POA: Diagnosis not present

## 2017-07-16 DIAGNOSIS — J3089 Other allergic rhinitis: Secondary | ICD-10-CM | POA: Diagnosis not present

## 2017-07-22 DIAGNOSIS — J301 Allergic rhinitis due to pollen: Secondary | ICD-10-CM | POA: Diagnosis not present

## 2017-07-22 DIAGNOSIS — J3089 Other allergic rhinitis: Secondary | ICD-10-CM | POA: Diagnosis not present

## 2017-07-22 DIAGNOSIS — J3081 Allergic rhinitis due to animal (cat) (dog) hair and dander: Secondary | ICD-10-CM | POA: Diagnosis not present

## 2017-07-26 DIAGNOSIS — J301 Allergic rhinitis due to pollen: Secondary | ICD-10-CM | POA: Diagnosis not present

## 2017-07-26 DIAGNOSIS — J3081 Allergic rhinitis due to animal (cat) (dog) hair and dander: Secondary | ICD-10-CM | POA: Diagnosis not present

## 2017-07-26 DIAGNOSIS — J3089 Other allergic rhinitis: Secondary | ICD-10-CM | POA: Diagnosis not present

## 2017-08-01 DIAGNOSIS — Z516 Encounter for desensitization to allergens: Secondary | ICD-10-CM | POA: Diagnosis not present

## 2017-08-07 DIAGNOSIS — Z516 Encounter for desensitization to allergens: Secondary | ICD-10-CM | POA: Diagnosis not present

## 2017-08-14 DIAGNOSIS — Z516 Encounter for desensitization to allergens: Secondary | ICD-10-CM | POA: Diagnosis not present

## 2017-08-16 DIAGNOSIS — J3089 Other allergic rhinitis: Secondary | ICD-10-CM | POA: Diagnosis not present

## 2017-08-16 DIAGNOSIS — J301 Allergic rhinitis due to pollen: Secondary | ICD-10-CM | POA: Diagnosis not present

## 2017-08-16 DIAGNOSIS — J3081 Allergic rhinitis due to animal (cat) (dog) hair and dander: Secondary | ICD-10-CM | POA: Diagnosis not present

## 2017-08-20 DIAGNOSIS — Z516 Encounter for desensitization to allergens: Secondary | ICD-10-CM | POA: Diagnosis not present

## 2017-08-28 DIAGNOSIS — Z516 Encounter for desensitization to allergens: Secondary | ICD-10-CM | POA: Diagnosis not present

## 2017-09-06 DIAGNOSIS — Z516 Encounter for desensitization to allergens: Secondary | ICD-10-CM | POA: Diagnosis not present

## 2017-09-10 DIAGNOSIS — Z516 Encounter for desensitization to allergens: Secondary | ICD-10-CM | POA: Diagnosis not present

## 2017-09-18 DIAGNOSIS — Z516 Encounter for desensitization to allergens: Secondary | ICD-10-CM | POA: Diagnosis not present

## 2017-09-20 DIAGNOSIS — Z0000 Encounter for general adult medical examination without abnormal findings: Secondary | ICD-10-CM | POA: Diagnosis not present

## 2017-09-25 DIAGNOSIS — Z516 Encounter for desensitization to allergens: Secondary | ICD-10-CM | POA: Diagnosis not present

## 2017-10-02 DIAGNOSIS — Z516 Encounter for desensitization to allergens: Secondary | ICD-10-CM | POA: Diagnosis not present

## 2017-10-09 DIAGNOSIS — Z516 Encounter for desensitization to allergens: Secondary | ICD-10-CM | POA: Diagnosis not present

## 2017-10-25 DIAGNOSIS — Z516 Encounter for desensitization to allergens: Secondary | ICD-10-CM | POA: Diagnosis not present

## 2017-10-31 DIAGNOSIS — Z516 Encounter for desensitization to allergens: Secondary | ICD-10-CM | POA: Diagnosis not present

## 2017-11-06 DIAGNOSIS — Z516 Encounter for desensitization to allergens: Secondary | ICD-10-CM | POA: Diagnosis not present

## 2017-11-13 DIAGNOSIS — Z516 Encounter for desensitization to allergens: Secondary | ICD-10-CM | POA: Diagnosis not present

## 2017-11-29 DIAGNOSIS — E538 Deficiency of other specified B group vitamins: Secondary | ICD-10-CM | POA: Diagnosis not present

## 2017-11-29 DIAGNOSIS — E876 Hypokalemia: Secondary | ICD-10-CM | POA: Diagnosis not present

## 2017-12-05 DIAGNOSIS — Z516 Encounter for desensitization to allergens: Secondary | ICD-10-CM | POA: Diagnosis not present

## 2017-12-13 DIAGNOSIS — Z516 Encounter for desensitization to allergens: Secondary | ICD-10-CM | POA: Diagnosis not present

## 2017-12-13 DIAGNOSIS — E039 Hypothyroidism, unspecified: Secondary | ICD-10-CM | POA: Diagnosis not present

## 2017-12-26 DIAGNOSIS — J301 Allergic rhinitis due to pollen: Secondary | ICD-10-CM | POA: Diagnosis not present

## 2017-12-27 DIAGNOSIS — J3081 Allergic rhinitis due to animal (cat) (dog) hair and dander: Secondary | ICD-10-CM | POA: Diagnosis not present

## 2017-12-27 DIAGNOSIS — J3089 Other allergic rhinitis: Secondary | ICD-10-CM | POA: Diagnosis not present

## 2018-01-08 DIAGNOSIS — J301 Allergic rhinitis due to pollen: Secondary | ICD-10-CM | POA: Diagnosis not present

## 2018-01-08 DIAGNOSIS — J3089 Other allergic rhinitis: Secondary | ICD-10-CM | POA: Diagnosis not present

## 2018-01-28 DIAGNOSIS — J3081 Allergic rhinitis due to animal (cat) (dog) hair and dander: Secondary | ICD-10-CM | POA: Diagnosis not present

## 2018-01-28 DIAGNOSIS — J3089 Other allergic rhinitis: Secondary | ICD-10-CM | POA: Diagnosis not present

## 2018-01-28 DIAGNOSIS — J301 Allergic rhinitis due to pollen: Secondary | ICD-10-CM | POA: Diagnosis not present

## 2018-01-31 DIAGNOSIS — J3089 Other allergic rhinitis: Secondary | ICD-10-CM | POA: Diagnosis not present

## 2018-01-31 DIAGNOSIS — J301 Allergic rhinitis due to pollen: Secondary | ICD-10-CM | POA: Diagnosis not present

## 2018-01-31 DIAGNOSIS — J3081 Allergic rhinitis due to animal (cat) (dog) hair and dander: Secondary | ICD-10-CM | POA: Diagnosis not present

## 2018-02-07 DIAGNOSIS — J3081 Allergic rhinitis due to animal (cat) (dog) hair and dander: Secondary | ICD-10-CM | POA: Diagnosis not present

## 2018-02-07 DIAGNOSIS — J3089 Other allergic rhinitis: Secondary | ICD-10-CM | POA: Diagnosis not present

## 2018-02-07 DIAGNOSIS — J301 Allergic rhinitis due to pollen: Secondary | ICD-10-CM | POA: Diagnosis not present

## 2018-02-13 DIAGNOSIS — J301 Allergic rhinitis due to pollen: Secondary | ICD-10-CM | POA: Diagnosis not present

## 2018-02-13 DIAGNOSIS — J3089 Other allergic rhinitis: Secondary | ICD-10-CM | POA: Diagnosis not present

## 2018-02-13 DIAGNOSIS — J3081 Allergic rhinitis due to animal (cat) (dog) hair and dander: Secondary | ICD-10-CM | POA: Diagnosis not present

## 2018-02-28 DIAGNOSIS — J3081 Allergic rhinitis due to animal (cat) (dog) hair and dander: Secondary | ICD-10-CM | POA: Diagnosis not present

## 2018-02-28 DIAGNOSIS — J3089 Other allergic rhinitis: Secondary | ICD-10-CM | POA: Diagnosis not present

## 2018-02-28 DIAGNOSIS — J301 Allergic rhinitis due to pollen: Secondary | ICD-10-CM | POA: Diagnosis not present

## 2018-03-07 DIAGNOSIS — J301 Allergic rhinitis due to pollen: Secondary | ICD-10-CM | POA: Diagnosis not present

## 2018-03-07 DIAGNOSIS — J3081 Allergic rhinitis due to animal (cat) (dog) hair and dander: Secondary | ICD-10-CM | POA: Diagnosis not present

## 2018-03-07 DIAGNOSIS — J3089 Other allergic rhinitis: Secondary | ICD-10-CM | POA: Diagnosis not present

## 2018-03-07 DIAGNOSIS — E538 Deficiency of other specified B group vitamins: Secondary | ICD-10-CM | POA: Diagnosis not present

## 2018-03-21 DIAGNOSIS — J3081 Allergic rhinitis due to animal (cat) (dog) hair and dander: Secondary | ICD-10-CM | POA: Diagnosis not present

## 2018-03-21 DIAGNOSIS — J3089 Other allergic rhinitis: Secondary | ICD-10-CM | POA: Diagnosis not present

## 2018-03-21 DIAGNOSIS — J301 Allergic rhinitis due to pollen: Secondary | ICD-10-CM | POA: Diagnosis not present

## 2018-04-10 DIAGNOSIS — E039 Hypothyroidism, unspecified: Secondary | ICD-10-CM | POA: Diagnosis not present

## 2018-04-19 DIAGNOSIS — M109 Gout, unspecified: Secondary | ICD-10-CM | POA: Diagnosis not present

## 2018-04-22 ENCOUNTER — Ambulatory Visit: Payer: Self-pay

## 2018-04-22 ENCOUNTER — Ambulatory Visit: Payer: BLUE CROSS/BLUE SHIELD | Admitting: Podiatry

## 2018-04-22 ENCOUNTER — Ambulatory Visit (INDEPENDENT_AMBULATORY_CARE_PROVIDER_SITE_OTHER): Payer: BLUE CROSS/BLUE SHIELD

## 2018-04-22 DIAGNOSIS — M7752 Other enthesopathy of left foot: Secondary | ICD-10-CM | POA: Diagnosis not present

## 2018-04-22 DIAGNOSIS — M109 Gout, unspecified: Secondary | ICD-10-CM

## 2018-04-22 DIAGNOSIS — M779 Enthesopathy, unspecified: Secondary | ICD-10-CM

## 2018-04-22 LAB — CBC WITH DIFFERENTIAL/PLATELET
BASOS ABS: 18 {cells}/uL (ref 0–200)
BASOS PCT: 0.3 %
Eosinophils Absolute: 390 cells/uL (ref 15–500)
Eosinophils Relative: 6.4 %
HCT: 45.5 % (ref 38.5–50.0)
Hemoglobin: 15.4 g/dL (ref 13.2–17.1)
Lymphs Abs: 1330 cells/uL (ref 850–3900)
MCH: 28.4 pg (ref 27.0–33.0)
MCHC: 33.8 g/dL (ref 32.0–36.0)
MCV: 83.8 fL (ref 80.0–100.0)
MONOS PCT: 8.9 %
MPV: 9.7 fL (ref 7.5–12.5)
NEUTROS PCT: 62.6 %
Neutro Abs: 3819 cells/uL (ref 1500–7800)
PLATELETS: 322 10*3/uL (ref 140–400)
RBC: 5.43 10*6/uL (ref 4.20–5.80)
RDW: 12.4 % (ref 11.0–15.0)
TOTAL LYMPHOCYTE: 21.8 %
WBC: 6.1 10*3/uL (ref 3.8–10.8)
WBCMIX: 543 {cells}/uL (ref 200–950)

## 2018-04-22 LAB — SEDIMENTATION RATE: SED RATE: 9 mm/h (ref 0–15)

## 2018-04-22 MED ORDER — COLCHICINE 0.6 MG PO CAPS: 1.0000 | ORAL_CAPSULE | Freq: Every day | ORAL | 0 refills | Status: DC

## 2018-04-22 NOTE — Patient Instructions (Signed)
Gout Gout is painful swelling that can happen in some of your joints. Gout is a type of arthritis. This condition is caused by having too much uric acid in your body. Uric acid is a chemical that is made when your body breaks down substances called purines. If your body has too much uric acid, sharp crystals can form and build up in your joints. This causes pain and swelling. Gout attacks can happen quickly and be very painful (acute gout). Over time, the attacks can affect more joints and happen more often (chronic gout). Follow these instructions at home: During a Gout Attack  If directed, put ice on the painful area: ? Put ice in a plastic bag. ? Place a towel between your skin and the bag. ? Leave the ice on for 20 minutes, 2-3 times a day.  Rest the joint as much as possible. If the joint is in your leg, you may be given crutches to use.  Raise (elevate) the painful joint above the level of your heart as often as you can.  Drink enough fluids to keep your pee (urine) clear or pale yellow.  Take over-the-counter and prescription medicines only as told by your doctor.  Do not drive or use heavy machinery while taking prescription pain medicine.  Follow instructions from your doctor about what you can or cannot eat and drink.  Return to your normal activities as told by your doctor. Ask your doctor what activities are safe for you. Avoiding Future Gout Attacks  Follow a low-purine diet as told by a specialist (dietitian) or your doctor. Avoid foods and drinks that have a lot of purines, such as: ? Liver. ? Kidney. ? Anchovies. ? Asparagus. ? Herring. ? Mushrooms ? Mussels. ? Beer.  Limit alcohol intake to no more than 1 drink a day for nonpregnant women and 2 drinks a day for men. One drink equals 12 oz of beer, 5 oz of wine, or 1 oz of hard liquor.  Stay at a healthy weight or lose weight if you are overweight. If you want to lose weight, talk with your doctor. It is  important that you do not lose weight too fast.  Start or continue an exercise plan as told by your doctor.  Drink enough fluids to keep your pee clear or pale yellow.  Take over-the-counter and prescription medicines only as told by your doctor.  Keep all follow-up visits as told by your doctor. This is important. Contact a doctor if:  You have another gout attack.  You still have symptoms of a gout attack after10 days of treatment.  You have problems (side effects) because of your medicines.  You have chills or a fever.  You have burning pain when you pee (urinate).  You have pain in your lower back or belly. Get help right away if:  You have very bad pain.  Your pain cannot be controlled.  You cannot pee. This information is not intended to replace advice given to you by your health care provider. Make sure you discuss any questions you have with your health care provider. Document Released: 09/25/2008 Document Revised: 05/24/2016 Document Reviewed: 09/29/2015 Elsevier Interactive Patient Education  2018 Elsevier Inc. Colchicine tablets or capsules What is this medicine? COLCHICINE (KOL chi seen) is for joint pain and swelling due to attacks of acute gouty arthritis. The medicine is also used to treat familial Mediterranean fever. This medicine may be used for other purposes; ask your health care provider or pharmacist   if you have questions. COMMON BRAND NAME(S): Colcrys, MITIGARE What should I tell my health care provider before I take this medicine? They need to know if you have any of these conditions: -anemia -blood disorders like leukemia or lymphoma -heart disease -immune system problems -intestinal disease -kidney disease -liver disease -muscle pain or weakness -take other medicines -stomach problems -an unusual or allergic reaction to colchicine, other medicines, lactose, foods, dyes, or preservatives -pregnant or trying to get  pregnant -breast-feeding How should I use this medicine? Take this medicine by mouth with a full glass of water. Follow the directions on the prescription label. You can take it with or without food. If it upsets your stomach, take it with food. Take your medicine at regular intervals. Do not take your medicine more often than directed. A special MedGuide will be given to you by the pharmacist with each prescription and refill. Be sure to read this information carefully each time. Talk to your pediatrician regarding the use of this medicine in children. While this drug may be prescribed for children as young as 4 years old for selected conditions, precautions do apply. Patients over 65 years old may have a stronger reaction and need a smaller dose. Overdosage: If you think you have taken too much of this medicine contact a poison control center or emergency room at once. NOTE: This medicine is only for you. Do not share this medicine with others. What if I miss a dose? If you miss a dose, take it as soon as you can. If it is almost time for your next dose, take only that dose. Do not take double or extra doses. What may interact with this medicine? Do not take this medicine with any of the following medications: -certain medicines for fungal infections like itraconazole This medicine may also interact with the following medications: -alcohol -certain medicines for cholesterol like atorvastatin -certain medicines for coughs and colds -certain medicines to help you breathe better -cyclosporine -digoxin -epinephrine -grapefruit or grapefruit juice -methenamine -other medicines for fungal infection -sodium bicarbonate -some antibiotics like clarithromycin, erythromycin, and telithromycin -some medicines for an irregular heartbeat or other heart problems -some medicines for cancer, like lapatinib and tamoxifen -some medicines for HIV This list may not describe all possible interactions. Give  your health care provider a list of all the medicines, herbs, non-prescription drugs, or dietary supplements you use. Also tell them if you smoke, drink alcohol, or use illegal drugs. Some items may interact with your medicine. What should I watch for while using this medicine? Visit your doctor or health care professional for regular checks on your progress. You may need periodic blood checks. Alcohol can increase the chance of getting stomach problems and gout attacks. Do not drink alcohol. What side effects may I notice from receiving this medicine? Side effects that you should report to your doctor or health care professional as soon as possible: -allergic reactions like skin rash, itching or hives, swelling of the face, lips, or tongue -fever, chills, or sore throat -muscle tenderness, pain, or weakness -numbness or tingling in hands or feet -unusual bleeding or bruising -unusually weak or tired -vomiting Side effects that usually do not require medical attention (report to your doctor or health care professional if they continue or are bothersome): -diarrhea -hair loss -loss of appetite -stomach pain or nausea This list may not describe all possible side effects. Call your doctor for medical advice about side effects. You may report side effects to FDA at 1-800-FDA-1088.   Where should I keep my medicine? Keep out of the reach of children. Store at room temperature between 15 and 30 degrees C (59 and 86 degrees F). Keep container tightly closed. Protect from light. Throw away any unused medicine after the expiration date. NOTE: This sheet is a summary. It may not cover all possible information. If you have questions about this medicine, talk to your doctor, pharmacist, or health care provider.  2018 Elsevier/Gold Standard (2013-06-15 16:48:38)  

## 2018-04-23 LAB — C-REACTIVE PROTEIN: CRP: 10 mg/L — AB (ref <8.0)

## 2018-04-23 NOTE — Progress Notes (Signed)
Subjective: 47 year old male presents the office today for concerns of left foot pain, swelling.  He did go to urgent care over the weekend and he was given colchicine for gout.  He states that the pain came on suddenly without any injury.  He states the pain has gotten better since starting the colchicine but discontinued he points to the big toe joint.  Denies any change in diet or activity. Denies any systemic complaints such as fevers, chills, nausea, vomiting. No acute changes since last appointment, and no other complaints at this time.   Objective: AAO x3, NAD DP/PT pulses palpable bilaterally, CRT less than 3 seconds There is swelling on the first MTPJ of the left foot and there is discomfort with MPJ range of motion.  There is mild increase in warmth as well as erythema to the area.  There is no fluctuation or crepitation there is no open lesion identified.  No other areas of tenderness identified bilaterally.  Nno open lesions or pre-ulcerative lesions.  No pain with calf compression, swelling, warmth, erythema  Assessment: Capsulitis, gout left foot  Plan: -All treatment options discussed with the patient including all alternatives, risks, complications.  -X-rays were obtained and reviewed.  No evidence of acute fracture.  Arthritic changes present the first MPJ mildly. -Discussed a steroid injection he wished to proceed.  Skin was prepped with Betadine and then a mixture of 1 cc Kenalog 10, 0.5 cc of Marcaine plain, 0.5 cc of lidocaine plain was infiltrated into and around the first MPJ without complications.  Postinjection care was discussed. -We will check blood work including ESR, CRP, CBC, uric acid -Continue colchicine.  [place] getting [place] go [place] the [place].  He is asking for a refill in case he would have a flare while out of the country and I did refill this but directed on use. -Patient encouraged to call the office with any questions, concerns, change in symptoms.    Trula Slade DPM

## 2018-04-29 ENCOUNTER — Telehealth: Payer: Self-pay | Admitting: *Deleted

## 2018-04-29 DIAGNOSIS — M109 Gout, unspecified: Secondary | ICD-10-CM

## 2018-04-29 NOTE — Telephone Encounter (Signed)
Reordered gout labs for [employer]. Left message for pt to go to [facility] to have the gout/uric acid drawn.

## 2018-04-29 NOTE — Telephone Encounter (Signed)
Yes, please check uric acid. Not sure why it wasn't done as it was ordered.

## 2018-04-29 NOTE — Telephone Encounter (Signed)
[facility] - [place] states the order does not show in their system, needs to be reordered.

## 2018-04-29 NOTE — Telephone Encounter (Signed)
-----   Message from Vivi Barrack, DPM sent at 04/29/2018  8:42 AM EDT ----- Val- I have been waiting on his uric acid but it has not come back yet and should have. Can you call to check on this? Thanks.

## 2018-04-29 NOTE — Telephone Encounter (Signed)
Dr. Ardelle Anton requested the uric acid to be reorder, and I reordered and informed pt in another Telephone Call to go to [facility] to have performed.

## 2018-05-02 ENCOUNTER — Telehealth: Payer: Self-pay | Admitting: Podiatry

## 2018-05-02 NOTE — Telephone Encounter (Signed)
I informed pt's wife, [place] pt did not have to have blood test if he was not having symptoms.

## 2018-05-02 NOTE — Telephone Encounter (Signed)
If his pain has resolved then will hold off on the test. Thanks.

## 2018-05-02 NOTE — Telephone Encounter (Signed)
I'm calling for my husband in regards to his blood work. Somebody left a message that we could not understand. If you could please call me back at [phone]. Thank you.

## 2018-05-02 NOTE — Telephone Encounter (Signed)
I informed pt's wife, [place] the order for Uric Acid had not been drawn at the time of the initial bloodwork, so had to be reordered. [place] states pt has begun the [employer] and is not having symptoms, should he still have the test. I told [place] I would ask Dr. Ardelle Anton and call again.

## 2018-05-30 DIAGNOSIS — E559 Vitamin D deficiency, unspecified: Secondary | ICD-10-CM | POA: Diagnosis not present

## 2018-05-30 DIAGNOSIS — M109 Gout, unspecified: Secondary | ICD-10-CM | POA: Diagnosis not present

## 2018-05-30 DIAGNOSIS — Z23 Encounter for immunization: Secondary | ICD-10-CM | POA: Diagnosis not present

## 2018-05-30 DIAGNOSIS — E538 Deficiency of other specified B group vitamins: Secondary | ICD-10-CM | POA: Diagnosis not present

## 2018-05-30 DIAGNOSIS — E039 Hypothyroidism, unspecified: Secondary | ICD-10-CM | POA: Diagnosis not present

## 2018-07-10 DIAGNOSIS — Z0000 Encounter for general adult medical examination without abnormal findings: Secondary | ICD-10-CM | POA: Diagnosis not present

## 2018-08-04 DIAGNOSIS — M9902 Segmental and somatic dysfunction of thoracic region: Secondary | ICD-10-CM | POA: Diagnosis not present

## 2018-08-04 DIAGNOSIS — M47812 Spondylosis without myelopathy or radiculopathy, cervical region: Secondary | ICD-10-CM | POA: Diagnosis not present

## 2018-08-04 DIAGNOSIS — M47814 Spondylosis without myelopathy or radiculopathy, thoracic region: Secondary | ICD-10-CM | POA: Diagnosis not present

## 2018-08-04 DIAGNOSIS — M9901 Segmental and somatic dysfunction of cervical region: Secondary | ICD-10-CM | POA: Diagnosis not present

## 2018-08-13 DIAGNOSIS — J3089 Other allergic rhinitis: Secondary | ICD-10-CM | POA: Diagnosis not present

## 2018-08-13 DIAGNOSIS — J3081 Allergic rhinitis due to animal (cat) (dog) hair and dander: Secondary | ICD-10-CM | POA: Diagnosis not present

## 2018-08-13 DIAGNOSIS — J301 Allergic rhinitis due to pollen: Secondary | ICD-10-CM | POA: Diagnosis not present

## 2018-08-20 DIAGNOSIS — E039 Hypothyroidism, unspecified: Secondary | ICD-10-CM | POA: Diagnosis not present

## 2018-08-20 DIAGNOSIS — M25561 Pain in right knee: Secondary | ICD-10-CM | POA: Diagnosis not present

## 2018-08-20 DIAGNOSIS — J3081 Allergic rhinitis due to animal (cat) (dog) hair and dander: Secondary | ICD-10-CM | POA: Diagnosis not present

## 2018-08-20 DIAGNOSIS — J3089 Other allergic rhinitis: Secondary | ICD-10-CM | POA: Diagnosis not present

## 2018-08-20 DIAGNOSIS — M79671 Pain in right foot: Secondary | ICD-10-CM | POA: Diagnosis not present

## 2018-08-20 DIAGNOSIS — J301 Allergic rhinitis due to pollen: Secondary | ICD-10-CM | POA: Diagnosis not present

## 2018-08-21 DIAGNOSIS — D3122 Benign neoplasm of left retina: Secondary | ICD-10-CM | POA: Diagnosis not present

## 2018-08-28 DIAGNOSIS — J3081 Allergic rhinitis due to animal (cat) (dog) hair and dander: Secondary | ICD-10-CM | POA: Diagnosis not present

## 2018-08-28 DIAGNOSIS — J301 Allergic rhinitis due to pollen: Secondary | ICD-10-CM | POA: Diagnosis not present

## 2018-08-28 DIAGNOSIS — J3089 Other allergic rhinitis: Secondary | ICD-10-CM | POA: Diagnosis not present

## 2018-09-04 DIAGNOSIS — J301 Allergic rhinitis due to pollen: Secondary | ICD-10-CM | POA: Diagnosis not present

## 2018-09-04 DIAGNOSIS — J3089 Other allergic rhinitis: Secondary | ICD-10-CM | POA: Diagnosis not present

## 2018-09-04 DIAGNOSIS — J3081 Allergic rhinitis due to animal (cat) (dog) hair and dander: Secondary | ICD-10-CM | POA: Diagnosis not present

## 2018-09-09 ENCOUNTER — Other Ambulatory Visit: Payer: Self-pay | Admitting: Family Medicine

## 2018-09-09 ENCOUNTER — Ambulatory Visit
Admission: RE | Admit: 2018-09-09 | Discharge: 2018-09-09 | Disposition: A | Discharge status: Home / Self Care | Payer: BLUE CROSS/BLUE SHIELD | Source: Ambulatory Visit | Attending: Family Medicine | Admitting: Family Medicine

## 2018-09-09 DIAGNOSIS — M1711 Unilateral primary osteoarthritis, right knee: Secondary | ICD-10-CM | POA: Diagnosis not present

## 2018-09-09 DIAGNOSIS — M25561 Pain in right knee: Secondary | ICD-10-CM

## 2018-09-09 DIAGNOSIS — M79671 Pain in right foot: Secondary | ICD-10-CM

## 2018-09-09 DIAGNOSIS — M19071 Primary osteoarthritis, right ankle and foot: Secondary | ICD-10-CM | POA: Diagnosis not present

## 2018-09-11 DIAGNOSIS — J3089 Other allergic rhinitis: Secondary | ICD-10-CM | POA: Diagnosis not present

## 2018-09-11 DIAGNOSIS — J3081 Allergic rhinitis due to animal (cat) (dog) hair and dander: Secondary | ICD-10-CM | POA: Diagnosis not present

## 2018-09-11 DIAGNOSIS — J301 Allergic rhinitis due to pollen: Secondary | ICD-10-CM | POA: Diagnosis not present

## 2018-09-17 DIAGNOSIS — J3089 Other allergic rhinitis: Secondary | ICD-10-CM | POA: Diagnosis not present

## 2018-09-17 DIAGNOSIS — J3081 Allergic rhinitis due to animal (cat) (dog) hair and dander: Secondary | ICD-10-CM | POA: Diagnosis not present

## 2018-09-17 DIAGNOSIS — J301 Allergic rhinitis due to pollen: Secondary | ICD-10-CM | POA: Diagnosis not present

## 2018-09-24 DIAGNOSIS — J3089 Other allergic rhinitis: Secondary | ICD-10-CM | POA: Diagnosis not present

## 2018-09-24 DIAGNOSIS — J301 Allergic rhinitis due to pollen: Secondary | ICD-10-CM | POA: Diagnosis not present

## 2018-09-24 DIAGNOSIS — J3081 Allergic rhinitis due to animal (cat) (dog) hair and dander: Secondary | ICD-10-CM | POA: Diagnosis not present

## 2018-09-30 DIAGNOSIS — M238X1 Other internal derangements of right knee: Secondary | ICD-10-CM | POA: Diagnosis not present

## 2018-09-30 DIAGNOSIS — J301 Allergic rhinitis due to pollen: Secondary | ICD-10-CM | POA: Diagnosis not present

## 2018-09-30 DIAGNOSIS — J3081 Allergic rhinitis due to animal (cat) (dog) hair and dander: Secondary | ICD-10-CM | POA: Diagnosis not present

## 2018-09-30 DIAGNOSIS — J3089 Other allergic rhinitis: Secondary | ICD-10-CM | POA: Diagnosis not present

## 2018-10-03 DIAGNOSIS — J3081 Allergic rhinitis due to animal (cat) (dog) hair and dander: Secondary | ICD-10-CM | POA: Diagnosis not present

## 2018-10-03 DIAGNOSIS — J301 Allergic rhinitis due to pollen: Secondary | ICD-10-CM | POA: Diagnosis not present

## 2018-10-03 DIAGNOSIS — J3089 Other allergic rhinitis: Secondary | ICD-10-CM | POA: Diagnosis not present

## 2018-10-10 DIAGNOSIS — J301 Allergic rhinitis due to pollen: Secondary | ICD-10-CM | POA: Diagnosis not present

## 2018-10-10 DIAGNOSIS — J3089 Other allergic rhinitis: Secondary | ICD-10-CM | POA: Diagnosis not present

## 2018-10-10 DIAGNOSIS — J3081 Allergic rhinitis due to animal (cat) (dog) hair and dander: Secondary | ICD-10-CM | POA: Diagnosis not present

## 2018-10-16 DIAGNOSIS — J3089 Other allergic rhinitis: Secondary | ICD-10-CM | POA: Diagnosis not present

## 2018-10-16 DIAGNOSIS — J3081 Allergic rhinitis due to animal (cat) (dog) hair and dander: Secondary | ICD-10-CM | POA: Diagnosis not present

## 2018-10-16 DIAGNOSIS — J301 Allergic rhinitis due to pollen: Secondary | ICD-10-CM | POA: Diagnosis not present

## 2018-10-23 DIAGNOSIS — J3089 Other allergic rhinitis: Secondary | ICD-10-CM | POA: Diagnosis not present

## 2018-10-23 DIAGNOSIS — J3081 Allergic rhinitis due to animal (cat) (dog) hair and dander: Secondary | ICD-10-CM | POA: Diagnosis not present

## 2018-10-23 DIAGNOSIS — J301 Allergic rhinitis due to pollen: Secondary | ICD-10-CM | POA: Diagnosis not present

## 2018-10-30 DIAGNOSIS — J3089 Other allergic rhinitis: Secondary | ICD-10-CM | POA: Diagnosis not present

## 2018-10-30 DIAGNOSIS — J301 Allergic rhinitis due to pollen: Secondary | ICD-10-CM | POA: Diagnosis not present

## 2018-10-30 DIAGNOSIS — J3081 Allergic rhinitis due to animal (cat) (dog) hair and dander: Secondary | ICD-10-CM | POA: Diagnosis not present

## 2018-11-06 DIAGNOSIS — J3081 Allergic rhinitis due to animal (cat) (dog) hair and dander: Secondary | ICD-10-CM | POA: Diagnosis not present

## 2018-11-06 DIAGNOSIS — J3089 Other allergic rhinitis: Secondary | ICD-10-CM | POA: Diagnosis not present

## 2018-11-06 DIAGNOSIS — J301 Allergic rhinitis due to pollen: Secondary | ICD-10-CM | POA: Diagnosis not present

## 2018-11-19 DIAGNOSIS — J3089 Other allergic rhinitis: Secondary | ICD-10-CM | POA: Diagnosis not present

## 2018-11-19 DIAGNOSIS — J301 Allergic rhinitis due to pollen: Secondary | ICD-10-CM | POA: Diagnosis not present

## 2018-11-19 DIAGNOSIS — J3081 Allergic rhinitis due to animal (cat) (dog) hair and dander: Secondary | ICD-10-CM | POA: Diagnosis not present

## 2018-12-02 DIAGNOSIS — J301 Allergic rhinitis due to pollen: Secondary | ICD-10-CM | POA: Diagnosis not present

## 2018-12-02 DIAGNOSIS — J3081 Allergic rhinitis due to animal (cat) (dog) hair and dander: Secondary | ICD-10-CM | POA: Diagnosis not present

## 2018-12-02 DIAGNOSIS — J3089 Other allergic rhinitis: Secondary | ICD-10-CM | POA: Diagnosis not present

## 2018-12-09 DIAGNOSIS — J301 Allergic rhinitis due to pollen: Secondary | ICD-10-CM | POA: Diagnosis not present

## 2018-12-09 DIAGNOSIS — J3081 Allergic rhinitis due to animal (cat) (dog) hair and dander: Secondary | ICD-10-CM | POA: Diagnosis not present

## 2018-12-09 DIAGNOSIS — J3089 Other allergic rhinitis: Secondary | ICD-10-CM | POA: Diagnosis not present

## 2018-12-10 DIAGNOSIS — Z0000 Encounter for general adult medical examination without abnormal findings: Secondary | ICD-10-CM | POA: Diagnosis not present

## 2018-12-15 DIAGNOSIS — J301 Allergic rhinitis due to pollen: Secondary | ICD-10-CM | POA: Diagnosis not present

## 2018-12-16 DIAGNOSIS — J3081 Allergic rhinitis due to animal (cat) (dog) hair and dander: Secondary | ICD-10-CM | POA: Diagnosis not present

## 2018-12-16 DIAGNOSIS — J3089 Other allergic rhinitis: Secondary | ICD-10-CM | POA: Diagnosis not present

## 2018-12-16 DIAGNOSIS — J301 Allergic rhinitis due to pollen: Secondary | ICD-10-CM | POA: Diagnosis not present

## 2018-12-18 DIAGNOSIS — J301 Allergic rhinitis due to pollen: Secondary | ICD-10-CM | POA: Diagnosis not present

## 2018-12-18 DIAGNOSIS — J3089 Other allergic rhinitis: Secondary | ICD-10-CM | POA: Diagnosis not present

## 2018-12-18 DIAGNOSIS — J3081 Allergic rhinitis due to animal (cat) (dog) hair and dander: Secondary | ICD-10-CM | POA: Diagnosis not present

## 2018-12-25 DIAGNOSIS — J301 Allergic rhinitis due to pollen: Secondary | ICD-10-CM | POA: Diagnosis not present

## 2018-12-25 DIAGNOSIS — J3089 Other allergic rhinitis: Secondary | ICD-10-CM | POA: Diagnosis not present

## 2018-12-25 DIAGNOSIS — J3081 Allergic rhinitis due to animal (cat) (dog) hair and dander: Secondary | ICD-10-CM | POA: Diagnosis not present

## 2019-01-01 DIAGNOSIS — J301 Allergic rhinitis due to pollen: Secondary | ICD-10-CM | POA: Diagnosis not present

## 2019-01-01 DIAGNOSIS — J3081 Allergic rhinitis due to animal (cat) (dog) hair and dander: Secondary | ICD-10-CM | POA: Diagnosis not present

## 2019-01-01 DIAGNOSIS — J3089 Other allergic rhinitis: Secondary | ICD-10-CM | POA: Diagnosis not present

## 2019-01-08 DIAGNOSIS — J3089 Other allergic rhinitis: Secondary | ICD-10-CM | POA: Diagnosis not present

## 2019-01-08 DIAGNOSIS — J301 Allergic rhinitis due to pollen: Secondary | ICD-10-CM | POA: Diagnosis not present

## 2019-01-08 DIAGNOSIS — J3081 Allergic rhinitis due to animal (cat) (dog) hair and dander: Secondary | ICD-10-CM | POA: Diagnosis not present

## 2019-01-15 DIAGNOSIS — J3089 Other allergic rhinitis: Secondary | ICD-10-CM | POA: Diagnosis not present

## 2019-01-15 DIAGNOSIS — J301 Allergic rhinitis due to pollen: Secondary | ICD-10-CM | POA: Diagnosis not present

## 2019-01-15 DIAGNOSIS — J3081 Allergic rhinitis due to animal (cat) (dog) hair and dander: Secondary | ICD-10-CM | POA: Diagnosis not present

## 2019-01-22 DIAGNOSIS — J3089 Other allergic rhinitis: Secondary | ICD-10-CM | POA: Diagnosis not present

## 2019-01-22 DIAGNOSIS — J301 Allergic rhinitis due to pollen: Secondary | ICD-10-CM | POA: Diagnosis not present

## 2019-01-22 DIAGNOSIS — J3081 Allergic rhinitis due to animal (cat) (dog) hair and dander: Secondary | ICD-10-CM | POA: Diagnosis not present

## 2019-01-29 DIAGNOSIS — J3089 Other allergic rhinitis: Secondary | ICD-10-CM | POA: Diagnosis not present

## 2019-01-29 DIAGNOSIS — J3081 Allergic rhinitis due to animal (cat) (dog) hair and dander: Secondary | ICD-10-CM | POA: Diagnosis not present

## 2019-01-29 DIAGNOSIS — J301 Allergic rhinitis due to pollen: Secondary | ICD-10-CM | POA: Diagnosis not present

## 2019-03-05 DIAGNOSIS — J301 Allergic rhinitis due to pollen: Secondary | ICD-10-CM | POA: Diagnosis not present

## 2019-03-11 DIAGNOSIS — J3089 Other allergic rhinitis: Secondary | ICD-10-CM | POA: Diagnosis not present

## 2019-03-11 DIAGNOSIS — J301 Allergic rhinitis due to pollen: Secondary | ICD-10-CM | POA: Diagnosis not present

## 2019-07-02 DIAGNOSIS — E538 Deficiency of other specified B group vitamins: Secondary | ICD-10-CM | POA: Diagnosis not present

## 2019-07-02 DIAGNOSIS — Z0000 Encounter for general adult medical examination without abnormal findings: Secondary | ICD-10-CM | POA: Diagnosis not present

## 2019-07-02 DIAGNOSIS — Z125 Encounter for screening for malignant neoplasm of prostate: Secondary | ICD-10-CM | POA: Diagnosis not present

## 2019-07-02 DIAGNOSIS — Z13220 Encounter for screening for lipoid disorders: Secondary | ICD-10-CM | POA: Diagnosis not present

## 2019-07-02 DIAGNOSIS — E559 Vitamin D deficiency, unspecified: Secondary | ICD-10-CM | POA: Diagnosis not present

## 2019-07-02 DIAGNOSIS — E039 Hypothyroidism, unspecified: Secondary | ICD-10-CM | POA: Diagnosis not present

## 2019-07-06 DIAGNOSIS — Z0000 Encounter for general adult medical examination without abnormal findings: Secondary | ICD-10-CM | POA: Diagnosis not present

## 2019-09-04 DIAGNOSIS — L918 Other hypertrophic disorders of the skin: Secondary | ICD-10-CM | POA: Diagnosis not present

## 2019-09-04 DIAGNOSIS — N5089 Other specified disorders of the male genital organs: Secondary | ICD-10-CM | POA: Diagnosis not present

## 2019-09-10 DIAGNOSIS — L918 Other hypertrophic disorders of the skin: Secondary | ICD-10-CM | POA: Diagnosis not present

## 2019-09-17 ENCOUNTER — Other Ambulatory Visit: Payer: Self-pay

## 2019-09-17 ENCOUNTER — Ambulatory Visit: Payer: BC Managed Care – PPO | Admitting: Podiatry

## 2019-09-17 ENCOUNTER — Telehealth: Payer: Self-pay | Admitting: Podiatry

## 2019-09-17 DIAGNOSIS — M109 Gout, unspecified: Secondary | ICD-10-CM

## 2019-09-17 DIAGNOSIS — M7752 Other enthesopathy of left foot: Secondary | ICD-10-CM | POA: Diagnosis not present

## 2019-09-17 DIAGNOSIS — M779 Enthesopathy, unspecified: Secondary | ICD-10-CM

## 2019-09-17 MED ORDER — COLCHICINE 0.6 MG PO TABS: 0.6000 mg | ORAL_TABLET | Freq: Every day | ORAL | 0 refills | Status: DC

## 2019-09-17 NOTE — Telephone Encounter (Signed)
Patient is wondering if they can get labs at [place] or [address] instead of [facility]. Please call patient

## 2019-09-18 NOTE — Addendum Note (Signed)
Addended by: Harriett Sine D on: 09/18/2019 11:49 AM   Modules accepted: Orders

## 2019-09-18 NOTE — Progress Notes (Signed)
Subjective: 48 year old male presents the office today for concerns of gout reoccurrence to his left big toe.  He states that he started noticed swelling and discomfort he wants to go and have an injection performed before it gets to be too significant.  No recent injury or falls. Denies any systemic complaints such as fevers, chills, nausea, vomiting. No acute changes since last appointment, and no other complaints at this time.   Objective: AAO x3, NAD DP/PT pulses palpable bilaterally, CRT less than 3 seconds There is edema and mild erythema on the first MPJ mostly medial aspect.  Mild discomfort MPJ range of motion.  There is no other areas of tenderness.  No fluctuation crepitation. No open lesions or pre-ulcerative lesions.  No pain with calf compression, swelling, warmth, erythema  Assessment: Capsulitis, gout left foot  Plan: -All treatment options discussed with the patient including all alternatives, risks, complications.  -Steroid injection performed today.  See procedure note below.  Will check uric acid level.  Colchicine to take as needed. -Patient encouraged to call the office with any questions, concerns, change in symptoms.   Procedure: Injection Tendon/Ligament Discussed alternatives, risks, complications and verbal consent was obtained.  Location: Left first MPJ-into and around the joint. Skin Prep: Betadine Injectate: 0.5cc 0.5% marcaine plain, 0.5 cc 2% lidocaine plain and, 1 cc kenalog 10. Disposition: Patient tolerated procedure well. Injection site dressed with a band-aid.  Post-injection care was discussed and return precautions discussed.   Trula Slade DPM

## 2019-09-18 NOTE — Telephone Encounter (Signed)
Left message informing pt the uric acid labs would be in the [employer] computer.

## 2019-10-16 DIAGNOSIS — M109 Gout, unspecified: Secondary | ICD-10-CM | POA: Diagnosis not present

## 2019-10-17 LAB — URIC ACID: Uric Acid: 7 mg/dL (ref 3.7–8.6)

## 2019-10-21 ENCOUNTER — Telehealth: Payer: Self-pay | Admitting: *Deleted

## 2019-10-21 NOTE — Telephone Encounter (Signed)
I spoke to pt of Dr. Leigh Aurora review of results and recommendations. Pt states he would like to wait on the allopurinol, he only gets flares about every 18 months.

## 2019-10-21 NOTE — Telephone Encounter (Signed)
Left message on mobile phone for pt to call for results. 

## 2019-10-21 NOTE — Telephone Encounter (Signed)
-----   Message from Trula Slade, DPM sent at 10/20/2019  7:51 AM EDT ----- Uric acid level is 7 which is normal but as he is getting gout I would like to see that number closer to 6. We had discussed allopurinol. We could go ahead and start this or if he keeps getting recurrent flares we should consider this in the future.

## 2019-10-23 NOTE — Telephone Encounter (Signed)
Thanks for the update

## 2019-11-09 DIAGNOSIS — E559 Vitamin D deficiency, unspecified: Secondary | ICD-10-CM | POA: Diagnosis not present

## 2019-11-09 DIAGNOSIS — Z789 Other specified health status: Secondary | ICD-10-CM | POA: Diagnosis not present

## 2019-11-09 DIAGNOSIS — M109 Gout, unspecified: Secondary | ICD-10-CM | POA: Diagnosis not present

## 2019-11-09 DIAGNOSIS — E039 Hypothyroidism, unspecified: Secondary | ICD-10-CM | POA: Diagnosis not present

## 2019-11-09 DIAGNOSIS — E7800 Pure hypercholesterolemia, unspecified: Secondary | ICD-10-CM | POA: Diagnosis not present

## 2020-02-23 ENCOUNTER — Other Ambulatory Visit: Payer: Self-pay

## 2020-02-23 ENCOUNTER — Ambulatory Visit (INDEPENDENT_AMBULATORY_CARE_PROVIDER_SITE_OTHER): Payer: BC Managed Care – PPO | Admitting: Podiatry

## 2020-02-23 ENCOUNTER — Encounter: Payer: Self-pay | Admitting: Podiatry

## 2020-02-23 DIAGNOSIS — B078 Other viral warts: Secondary | ICD-10-CM | POA: Diagnosis not present

## 2020-02-23 DIAGNOSIS — Q828 Other specified congenital malformations of skin: Secondary | ICD-10-CM | POA: Diagnosis not present

## 2020-02-23 DIAGNOSIS — B079 Viral wart, unspecified: Secondary | ICD-10-CM

## 2020-02-23 NOTE — Patient Instructions (Signed)
Keep the bandage on for 24 hours. At that time, remove and clean with soap and water. If it hurts or burns before 24 hours go ahead and remove the bandage and wash with soap and water. Keep the area clean. If there is any blistering cover with antibiotic ointment and a bandage. Monitor for any redness, drainage, or other signs of infection. Call the office if any are to occur. If you have any questions, please call the office at [phone].  

## 2020-02-29 NOTE — Progress Notes (Signed)
Subjective: 49 year old male presents the office today for concerns of calluses to both of his feet with the left side worse than the right.  The ones on the left foot are causing discomfort.  He denies any redness or drainage or any swelling or any open sores. Denies any systemic complaints such as fevers, chills, nausea, vomiting. No acute changes since last appointment, and no other complaints at this time.   Objective: AAO x3, NAD DP/PT pulses palpable bilaterally, CRT less than 3 seconds Hyperkeratotic lesions right submetatarsal 1.  Left foot submetatarsal are small punctate annular hyperkeratotic lesions x3.  No ongoing ulceration or signs of infection.  Continue.  I have verrucoid changes.  No open lesions or pre-ulcerative lesions.  No pain with calf compression, swelling, warmth, erythema  Assessment: Left foot skin lesions, right foot callus  Plan: -All treatment options discussed with the patient including all alternatives, risks, complications.  -Lesions were all debrided without any complications or bleeding.  The left foot and small punctate lesions areas cleaned with alcohol pad was placed followed by salicylic acid and a bandage.  Post procedure instructions discussed.  Monitor for any signs or symptoms of infection -Patient encouraged to call the office with any questions, concerns, change in symptoms.   Vivi Barrack DPM

## 2020-03-01 DIAGNOSIS — E039 Hypothyroidism, unspecified: Secondary | ICD-10-CM | POA: Diagnosis not present

## 2020-03-01 DIAGNOSIS — Z79899 Other long term (current) drug therapy: Secondary | ICD-10-CM | POA: Diagnosis not present

## 2020-03-01 DIAGNOSIS — Z131 Encounter for screening for diabetes mellitus: Secondary | ICD-10-CM | POA: Diagnosis not present

## 2020-03-01 DIAGNOSIS — M109 Gout, unspecified: Secondary | ICD-10-CM | POA: Diagnosis not present

## 2020-03-01 DIAGNOSIS — E559 Vitamin D deficiency, unspecified: Secondary | ICD-10-CM | POA: Diagnosis not present

## 2020-04-12 DIAGNOSIS — E8881 Metabolic syndrome: Secondary | ICD-10-CM | POA: Diagnosis not present

## 2020-04-12 DIAGNOSIS — M109 Gout, unspecified: Secondary | ICD-10-CM | POA: Diagnosis not present

## 2020-04-12 DIAGNOSIS — T7840XD Allergy, unspecified, subsequent encounter: Secondary | ICD-10-CM | POA: Diagnosis not present

## 2020-04-12 DIAGNOSIS — E039 Hypothyroidism, unspecified: Secondary | ICD-10-CM | POA: Diagnosis not present

## 2020-08-29 DIAGNOSIS — E559 Vitamin D deficiency, unspecified: Secondary | ICD-10-CM | POA: Diagnosis not present

## 2020-08-29 DIAGNOSIS — E538 Deficiency of other specified B group vitamins: Secondary | ICD-10-CM | POA: Diagnosis not present

## 2020-08-29 DIAGNOSIS — Z0000 Encounter for general adult medical examination without abnormal findings: Secondary | ICD-10-CM | POA: Diagnosis not present

## 2020-08-29 DIAGNOSIS — E039 Hypothyroidism, unspecified: Secondary | ICD-10-CM | POA: Diagnosis not present

## 2020-08-29 DIAGNOSIS — M109 Gout, unspecified: Secondary | ICD-10-CM | POA: Diagnosis not present

## 2020-08-29 DIAGNOSIS — Z125 Encounter for screening for malignant neoplasm of prostate: Secondary | ICD-10-CM | POA: Diagnosis not present

## 2020-08-29 DIAGNOSIS — Z131 Encounter for screening for diabetes mellitus: Secondary | ICD-10-CM | POA: Diagnosis not present

## 2020-08-29 DIAGNOSIS — E7800 Pure hypercholesterolemia, unspecified: Secondary | ICD-10-CM | POA: Diagnosis not present

## 2020-09-03 IMAGING — DX DG KNEE COMPLETE 4+V*R*
4 series · 4 of 4 positions shown · non-contrast
Comparison: None.

CLINICAL DATA: Right medial knee pain for the past 4-5 months with
no known injury. Symptoms are worse when extending the knee and when
walking.

EXAM:
RIGHT KNEE - COMPLETE 4+ VIEW

[dg knee complete 4 views right (1 of 4)]
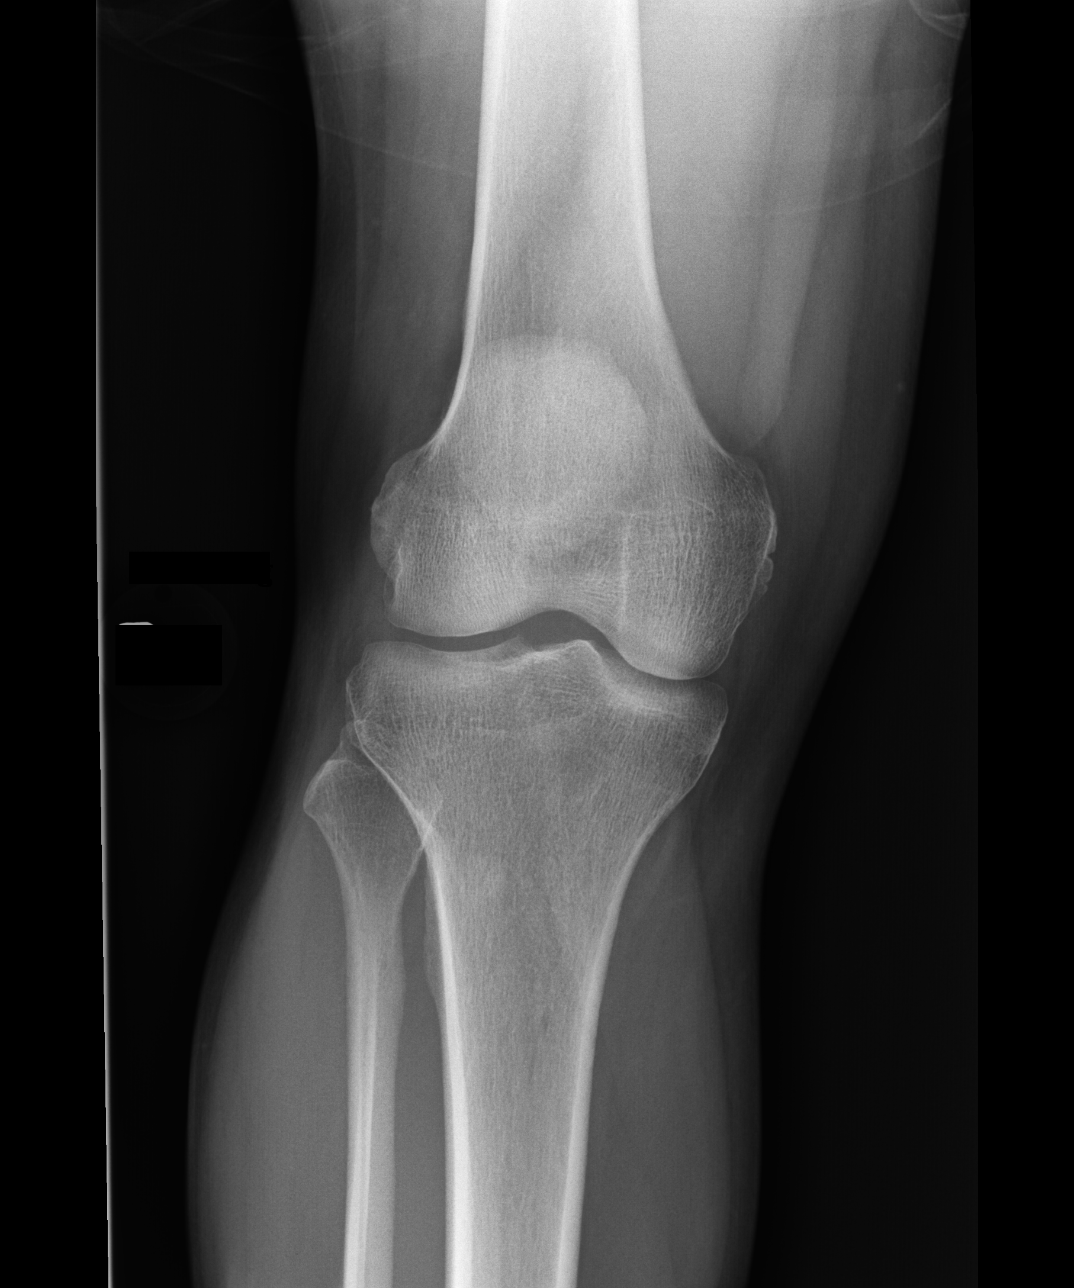

[dg knee complete 4 views right (2 of 4)]
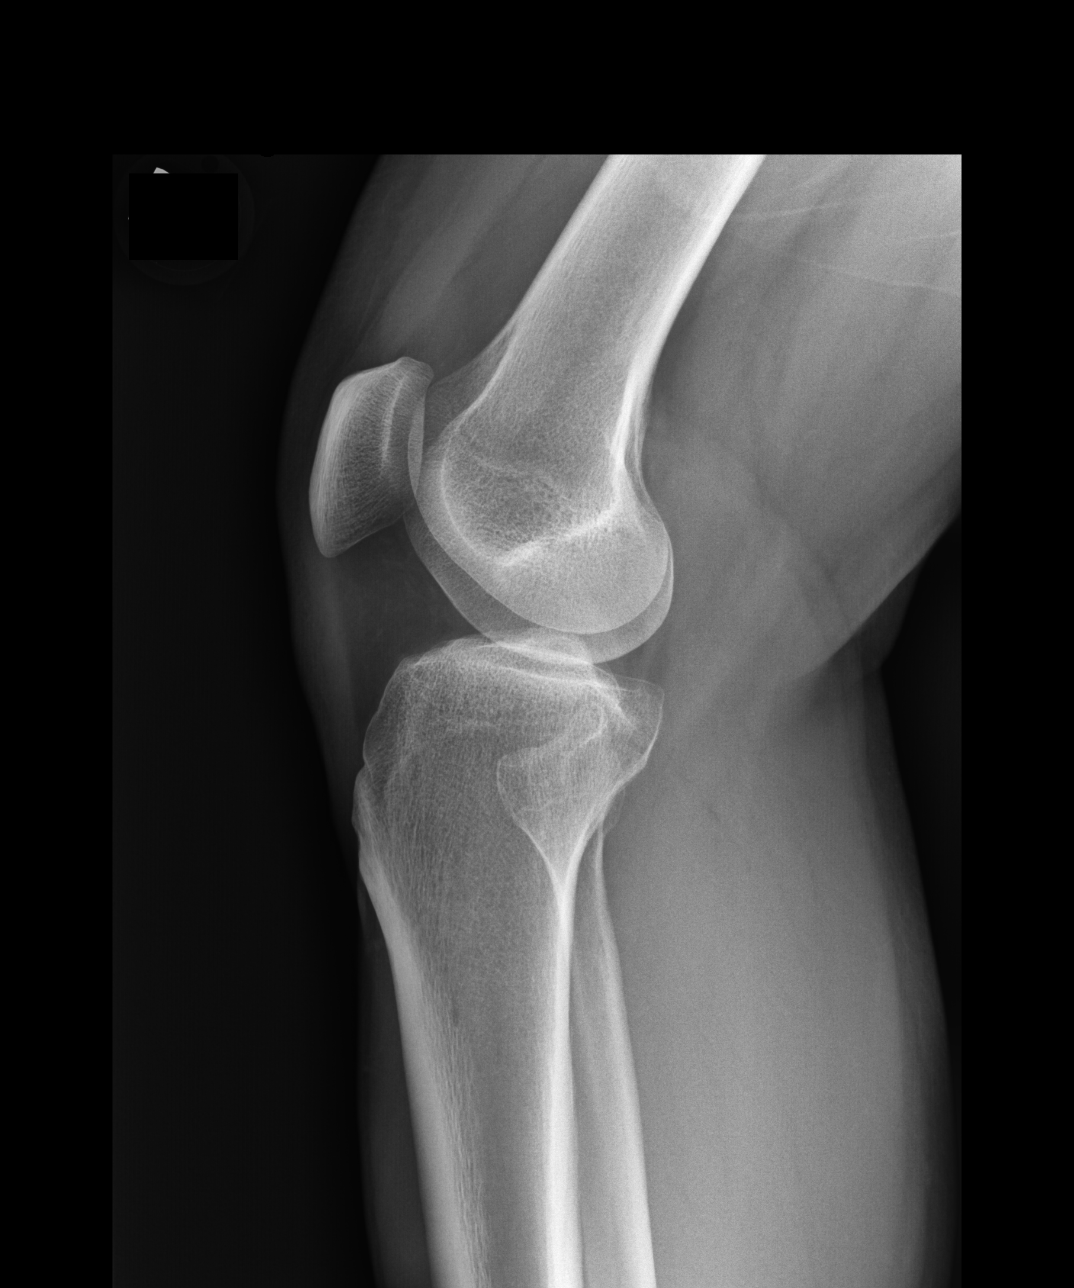

[dg knee complete 4 views right (3 of 4)]
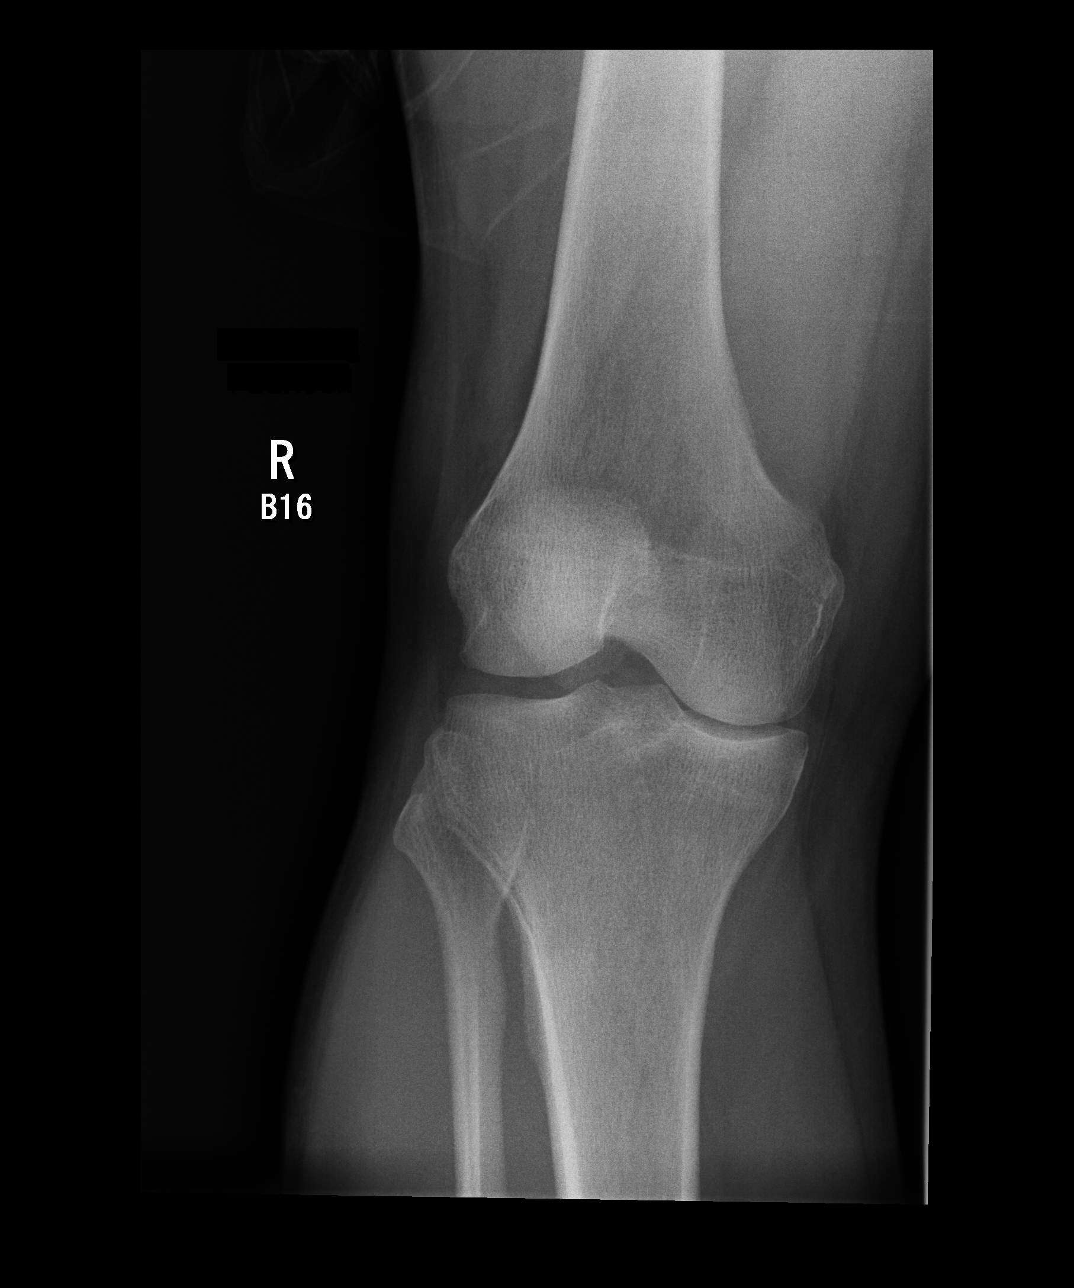

[dg knee complete 4 views right (4 of 4)]
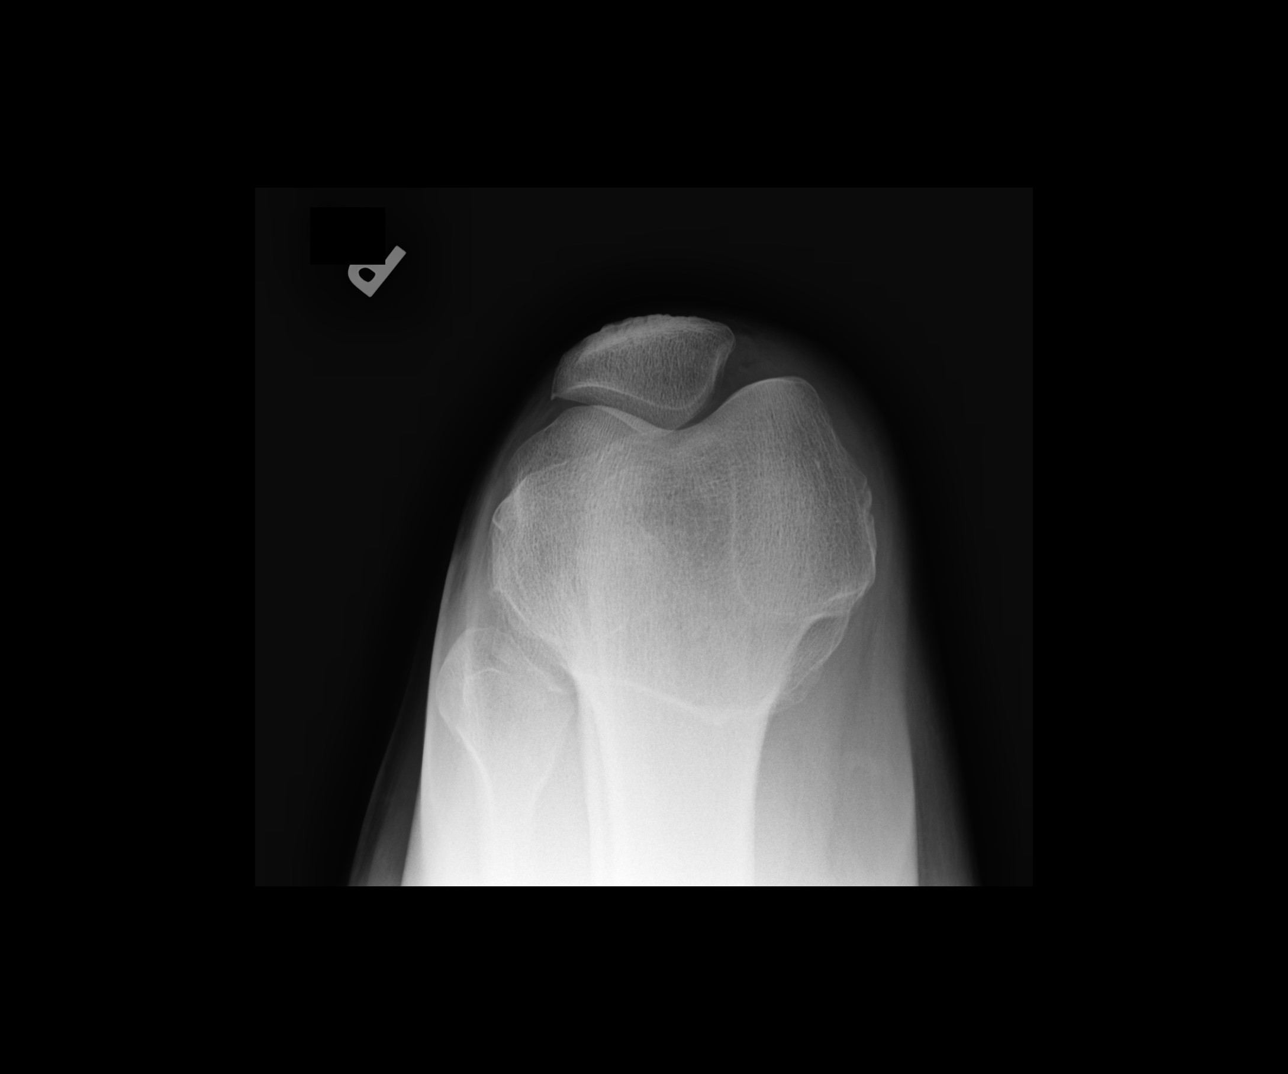

[4 of 4 positions shown; findings below may reference images not displayed]

FINDINGS: The bones are subjectively adequately mineralized. There is moderate
narrowing of the medial joint space in the standing position. There
is no acute or healing fracture. No significant osteophyte formation
is observed. There is no joint effusion.
IMPRESSION: There is moderate narrowing of the medial joint space which may
reflect osteoarthritis. There is no acute bony abnormality.

## 2020-09-03 IMAGING — DX DG FOOT 2V*R*
2 series · 2 of 2 positions shown · non-contrast
Comparison: Right foot series of April 27, 2015.

CLINICAL DATA: Plantar heel pain for the past 4-5 months with no
known injury. Pain increases when walking.

EXAM:
RIGHT FOOT - 2 VIEW

[dg foot 2 views right (1 of 2)]
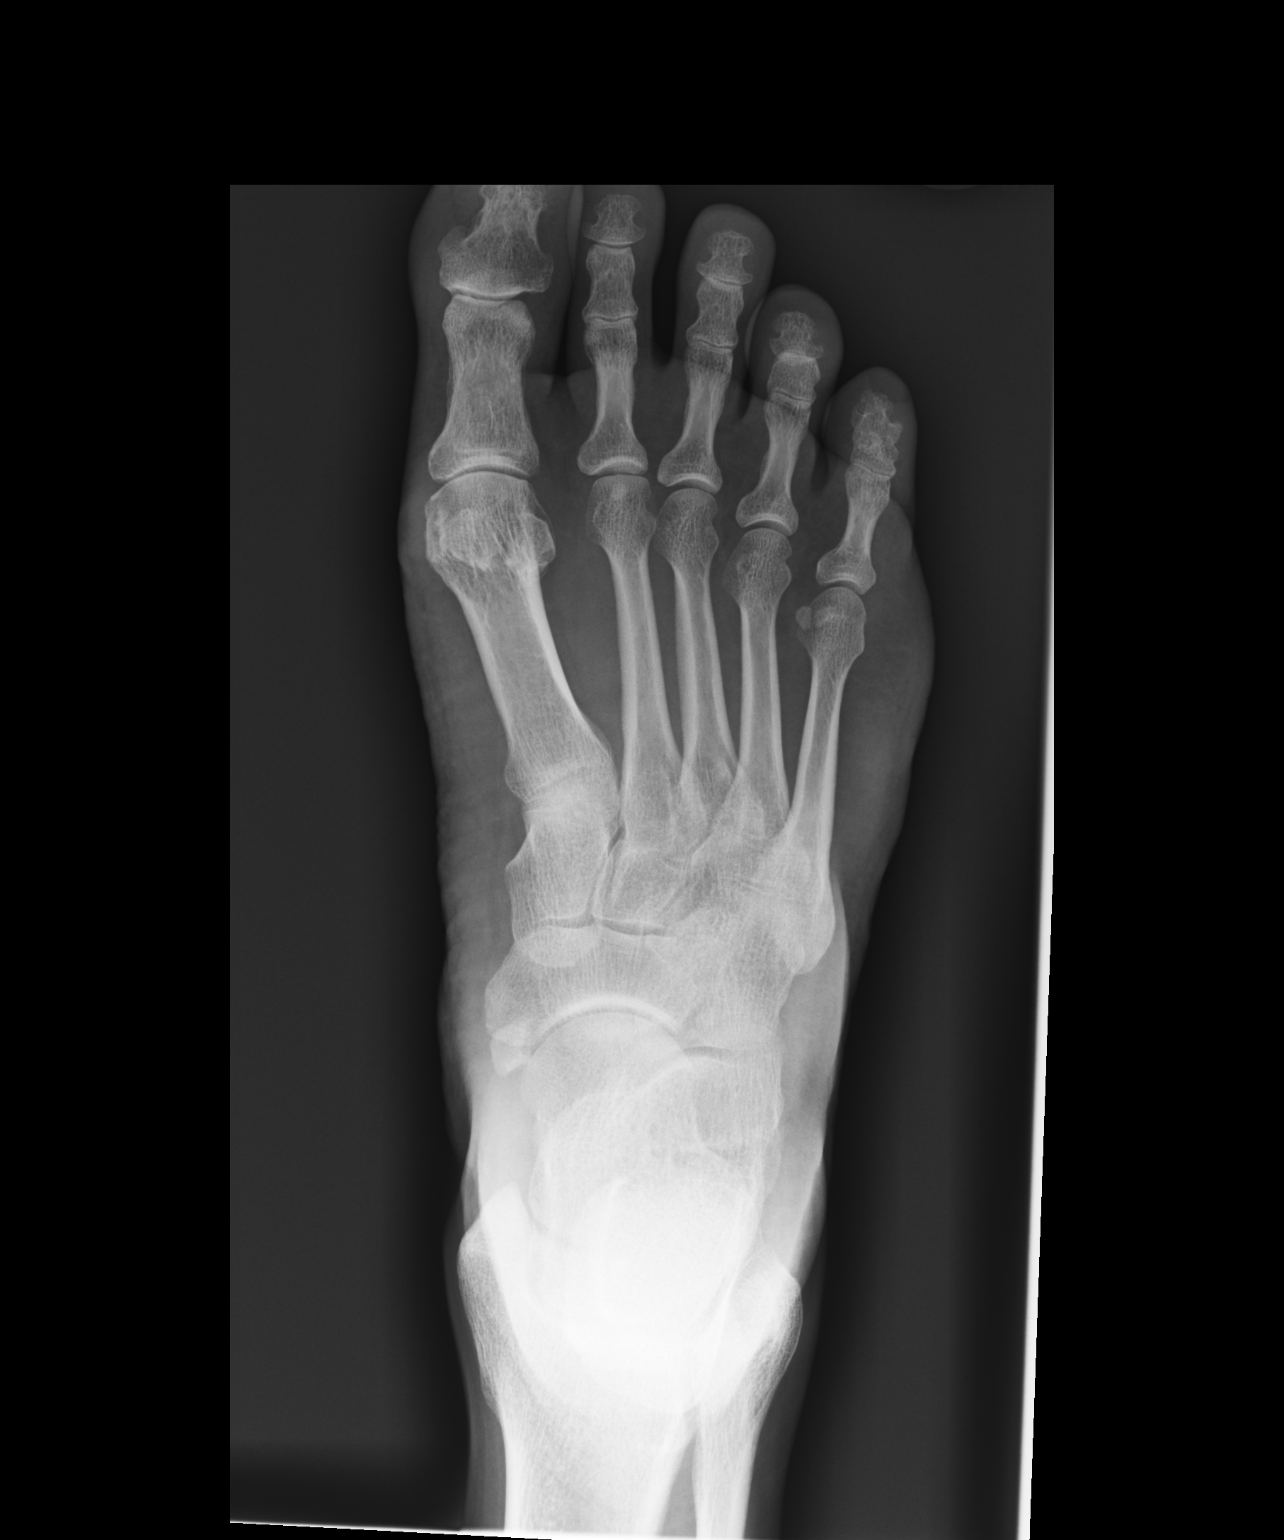

[dg foot 2 views right (2 of 2)]
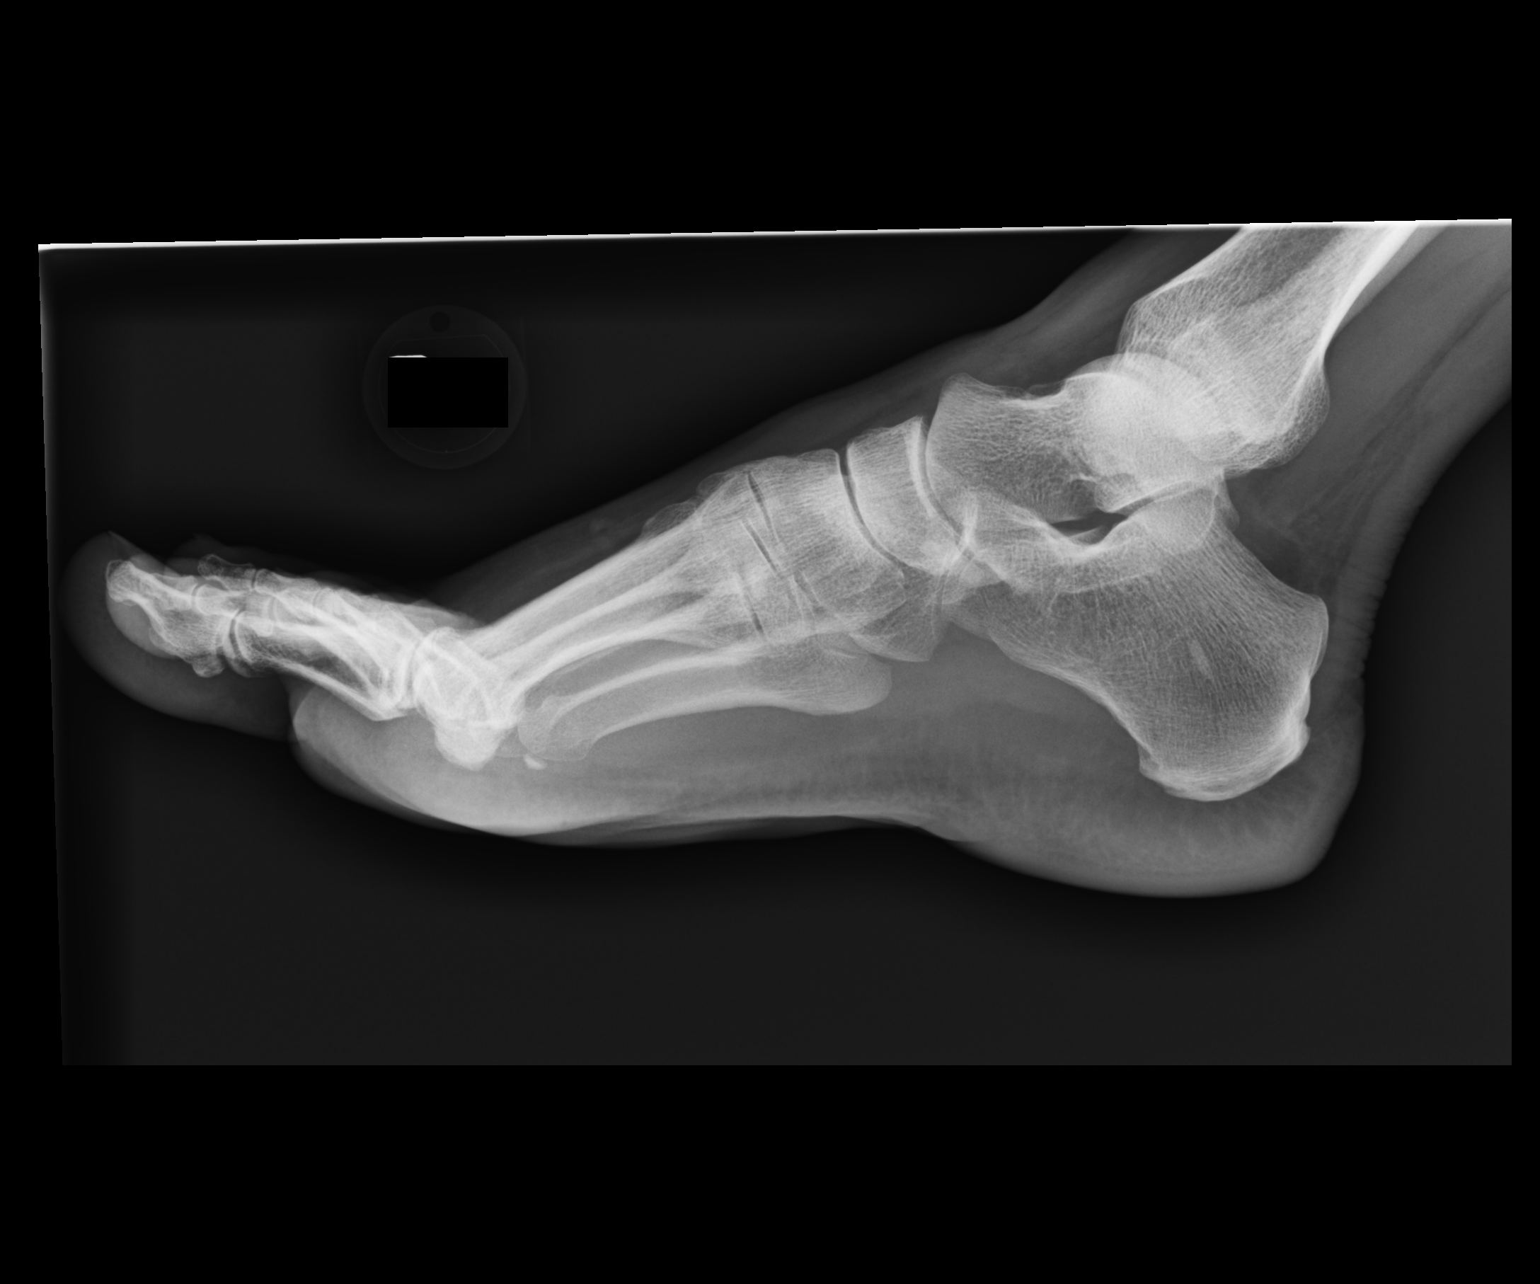

[2 of 2 positions shown; findings below may reference images not displayed]

FINDINGS: The bones are subjectively adequately mineralized. There is mild
narrowing of the interphalangeal joints and first MTP joint. The
tarsometatarsal joints exhibit very mild narrowing as well. The
intertarsal joint spaces are reasonably well-maintained. There is no
significant spurring of the calcaneus. The soft tissues of the heel
pad appear normal.
IMPRESSION: Mild osteoarthritic joint space loss as described. No acute bony
abnormality. No soft tissue abnormality along the plantar aspect of
the heel is observed. Observed.

## 2020-09-12 DIAGNOSIS — Z1211 Encounter for screening for malignant neoplasm of colon: Secondary | ICD-10-CM | POA: Diagnosis not present

## 2021-03-10 DIAGNOSIS — M109 Gout, unspecified: Secondary | ICD-10-CM | POA: Diagnosis not present

## 2021-03-10 DIAGNOSIS — E559 Vitamin D deficiency, unspecified: Secondary | ICD-10-CM | POA: Diagnosis not present

## 2021-03-10 DIAGNOSIS — E039 Hypothyroidism, unspecified: Secondary | ICD-10-CM | POA: Diagnosis not present

## 2021-04-28 DIAGNOSIS — M47814 Spondylosis without myelopathy or radiculopathy, thoracic region: Secondary | ICD-10-CM | POA: Diagnosis not present

## 2021-04-28 DIAGNOSIS — M9901 Segmental and somatic dysfunction of cervical region: Secondary | ICD-10-CM | POA: Diagnosis not present

## 2021-04-28 DIAGNOSIS — M9902 Segmental and somatic dysfunction of thoracic region: Secondary | ICD-10-CM | POA: Diagnosis not present

## 2021-04-28 DIAGNOSIS — M47812 Spondylosis without myelopathy or radiculopathy, cervical region: Secondary | ICD-10-CM | POA: Diagnosis not present

## 2022-02-19 DIAGNOSIS — D3122 Benign neoplasm of left retina: Secondary | ICD-10-CM | POA: Diagnosis not present

## 2022-06-01 DIAGNOSIS — Z125 Encounter for screening for malignant neoplasm of prostate: Secondary | ICD-10-CM | POA: Diagnosis not present

## 2022-06-01 DIAGNOSIS — E538 Deficiency of other specified B group vitamins: Secondary | ICD-10-CM | POA: Diagnosis not present

## 2022-06-01 DIAGNOSIS — E039 Hypothyroidism, unspecified: Secondary | ICD-10-CM | POA: Diagnosis not present

## 2022-06-01 DIAGNOSIS — E559 Vitamin D deficiency, unspecified: Secondary | ICD-10-CM | POA: Diagnosis not present

## 2022-06-01 DIAGNOSIS — Z0000 Encounter for general adult medical examination without abnormal findings: Secondary | ICD-10-CM | POA: Diagnosis not present

## 2022-06-01 DIAGNOSIS — E7800 Pure hypercholesterolemia, unspecified: Secondary | ICD-10-CM | POA: Diagnosis not present

## 2022-06-01 DIAGNOSIS — R7309 Other abnormal glucose: Secondary | ICD-10-CM | POA: Diagnosis not present

## 2022-06-05 DIAGNOSIS — Z1211 Encounter for screening for malignant neoplasm of colon: Secondary | ICD-10-CM | POA: Diagnosis not present
